# Patient Record
Sex: Female | Born: 1985 | Race: Asian | Hispanic: No | Marital: Married | Smoking: Never smoker
Health system: Southern US, Community
[De-identification: ages and names within clinical notes are randomized; demographics above are authoritative.]

## PROBLEM LIST (undated history)

## (undated) DIAGNOSIS — E039 Hypothyroidism, unspecified: Secondary | ICD-10-CM

## (undated) HISTORY — PX: NO PAST SURGERIES: SHX2092

---

## 2013-08-21 NOTE — L&D Delivery Note (Signed)
Delivery Note At 11:42 PM a viable female was delivered via Vaginal, Spontaneous Delivery (Presentation: ; Occiput Anterior).  APGAR: 8, 9; weight .   Placenta status: Intact, Spontaneous.  Cord: 3 vessels with the following complications: None. Marginal cord insertion and nuchal x 1 clamped and cut before delivery of shoulders. Cord pH: not indicated  Anesthesia: Epidural  Episiotomy: None Lacerations: 2nd degree;Perineal Suture Repair: 2.0 vicryl rapide Est. Blood Loss (mL): 400  Mom to postpartum.  Baby to Couplet care / Skin to Skin. Chorioamnionitis, on amp/ gent, will switch to gent/ clinda x 24 hrs.   [name] 06/09/2014, 12:05 AM

## 2013-10-14 ENCOUNTER — Other Ambulatory Visit: Payer: Self-pay | Admitting: Family Medicine

## 2013-10-14 DIAGNOSIS — E049 Nontoxic goiter, unspecified: Secondary | ICD-10-CM

## 2013-10-15 ENCOUNTER — Ambulatory Visit
Admission: RE | Admit: 2013-10-15 | Discharge: 2013-10-15 | Disposition: A | Discharge status: Home / Self Care | Payer: Managed Care, Other (non HMO) | Source: Ambulatory Visit | Attending: Family Medicine | Admitting: Family Medicine

## 2013-10-15 ENCOUNTER — Other Ambulatory Visit: Payer: Self-pay

## 2013-10-15 DIAGNOSIS — E049 Nontoxic goiter, unspecified: Secondary | ICD-10-CM

## 2013-10-20 ENCOUNTER — Other Ambulatory Visit: Payer: Self-pay | Admitting: Family Medicine

## 2013-10-20 DIAGNOSIS — E041 Nontoxic single thyroid nodule: Secondary | ICD-10-CM

## 2013-10-28 ENCOUNTER — Other Ambulatory Visit (HOSPITAL_COMMUNITY)
Admission: RE | Admit: 2013-10-28 | Discharge: 2013-10-28 | Disposition: A | Discharge status: Home / Self Care | Payer: Managed Care, Other (non HMO) | Source: Ambulatory Visit | Attending: Interventional Radiology | Admitting: Interventional Radiology

## 2013-10-28 ENCOUNTER — Ambulatory Visit
Admission: RE | Admit: 2013-10-28 | Discharge: 2013-10-28 | Disposition: A | Discharge status: Home / Self Care | Payer: Managed Care, Other (non HMO) | Source: Ambulatory Visit | Attending: Family Medicine | Admitting: Family Medicine

## 2013-10-28 DIAGNOSIS — E041 Nontoxic single thyroid nodule: Secondary | ICD-10-CM

## 2013-10-28 DIAGNOSIS — E049 Nontoxic goiter, unspecified: Secondary | ICD-10-CM | POA: Insufficient documentation

## 2014-06-08 ENCOUNTER — Encounter (HOSPITAL_COMMUNITY): Payer: Managed Care, Other (non HMO) | Admitting: Anesthesiology

## 2014-06-08 ENCOUNTER — Inpatient Hospital Stay (HOSPITAL_COMMUNITY)
Admission: AD | Admit: 2014-06-08 | Discharge: 2014-06-10 | DRG: 775 | Disposition: A | Discharge status: Home / Self Care | Payer: Managed Care, Other (non HMO) | Source: Ambulatory Visit | Attending: Obstetrics & Gynecology | Admitting: Obstetrics & Gynecology

## 2014-06-08 ENCOUNTER — Encounter (HOSPITAL_COMMUNITY): Payer: Self-pay | Admitting: *Deleted

## 2014-06-08 ENCOUNTER — Inpatient Hospital Stay (HOSPITAL_COMMUNITY): Payer: Managed Care, Other (non HMO) | Admitting: Anesthesiology

## 2014-06-08 DIAGNOSIS — O42913 Preterm premature rupture of membranes, unspecified as to length of time between rupture and onset of labor, third trimester: Principal | ICD-10-CM | POA: Diagnosis present

## 2014-06-08 DIAGNOSIS — O411230 Chorioamnionitis, third trimester, not applicable or unspecified: Secondary | ICD-10-CM | POA: Diagnosis present

## 2014-06-08 DIAGNOSIS — O99613 Diseases of the digestive system complicating pregnancy, third trimester: Secondary | ICD-10-CM | POA: Diagnosis present

## 2014-06-08 DIAGNOSIS — K219 Gastro-esophageal reflux disease without esophagitis: Secondary | ICD-10-CM | POA: Diagnosis present

## 2014-06-08 DIAGNOSIS — Z3A36 36 weeks gestation of pregnancy: Secondary | ICD-10-CM | POA: Diagnosis present

## 2014-06-08 DIAGNOSIS — O99113 Other diseases of the blood and blood-forming organs and certain disorders involving the immune mechanism complicating pregnancy, third trimester: Secondary | ICD-10-CM | POA: Diagnosis present

## 2014-06-08 DIAGNOSIS — E039 Hypothyroidism, unspecified: Secondary | ICD-10-CM | POA: Diagnosis present

## 2014-06-08 DIAGNOSIS — D72829 Elevated white blood cell count, unspecified: Secondary | ICD-10-CM | POA: Diagnosis present

## 2014-06-08 DIAGNOSIS — O99284 Endocrine, nutritional and metabolic diseases complicating childbirth: Secondary | ICD-10-CM | POA: Diagnosis present

## 2014-06-08 DIAGNOSIS — O411290 Chorioamnionitis, unspecified trimester, not applicable or unspecified: Secondary | ICD-10-CM | POA: Diagnosis not present

## 2014-06-08 HISTORY — DX: Hypothyroidism, unspecified: E03.9

## 2014-06-08 LAB — TYPE AND SCREEN
ABO/RH(D): A POS
Antibody Screen: NEGATIVE

## 2014-06-08 LAB — CBC
HCT: 40.4 % (ref 36.0–46.0)
Hemoglobin: 13.9 g/dL (ref 12.0–15.0)
MCH: 32 pg (ref 26.0–34.0)
MCHC: 34.4 g/dL (ref 30.0–36.0)
MCV: 92.9 fL (ref 78.0–100.0)
Platelets: 238 10*3/uL (ref 150–400)
RBC: 4.35 MIL/uL (ref 3.87–5.11)
RDW: 13.7 % (ref 11.5–15.5)
WBC: 11.4 10*3/uL — ABNORMAL HIGH (ref 4.0–10.5)

## 2014-06-08 LAB — OB RESULTS CONSOLE RUBELLA ANTIBODY, IGM: Rubella: IMMUNE

## 2014-06-08 LAB — OB RESULTS CONSOLE RPR: RPR: NONREACTIVE

## 2014-06-08 LAB — OB RESULTS CONSOLE ANTIBODY SCREEN: Antibody Screen: NEGATIVE

## 2014-06-08 LAB — RPR

## 2014-06-08 LAB — OB RESULTS CONSOLE HIV ANTIBODY (ROUTINE TESTING): HIV: NONREACTIVE

## 2014-06-08 LAB — POCT FERN TEST
POCT Fern Test: NEGATIVE
POCT Fern Test: POSITIVE

## 2014-06-08 LAB — OB RESULTS CONSOLE GC/CHLAMYDIA
Chlamydia: NEGATIVE
Gonorrhea: NEGATIVE

## 2014-06-08 LAB — OB RESULTS CONSOLE HEPATITIS B SURFACE ANTIGEN: HEP B S AG: NEGATIVE

## 2014-06-08 LAB — OB RESULTS CONSOLE ABO/RH: RH Type: POSITIVE

## 2014-06-08 LAB — ABO/RH: ABO/RH(D): A POS

## 2014-06-08 LAB — OB RESULTS CONSOLE GBS: GBS: NEGATIVE

## 2014-06-08 MED ORDER — SODIUM CHLORIDE 0.9 % IV SOLN
2.0000 g | Freq: Four times a day (QID) | INTRAVENOUS | Status: DC
  Administered 2014-06-08: 2 g via INTRAVENOUS
  Filled 2014-06-08 (×4): qty 2000

## 2014-06-08 MED ORDER — LACTATED RINGERS IV SOLN
INTRAVENOUS | Status: DC
  Administered 2014-06-08 (×2): via INTRAVENOUS

## 2014-06-08 MED ORDER — OXYTOCIN BOLUS FROM INFUSION
500.0000 mL | INTRAVENOUS | Status: DC
  Administered 2014-06-08: 500 mL via INTRAVENOUS

## 2014-06-08 MED ORDER — LACTATED RINGERS IV SOLN
500.0000 mL | INTRAVENOUS | Status: DC | PRN
  Administered 2014-06-08: 300 mL via INTRAVENOUS
  Administered 2014-06-08: 500 mL via INTRAVENOUS

## 2014-06-08 MED ORDER — LACTATED RINGERS IV SOLN
500.0000 mL | Freq: Once | INTRAVENOUS | Status: AC
  Administered 2014-06-08: 500 mL via INTRAVENOUS

## 2014-06-08 MED ORDER — DIPHENHYDRAMINE HCL 50 MG/ML IJ SOLN: 12.5000 mg | INTRAMUSCULAR | Status: DC | PRN

## 2014-06-08 MED ORDER — GENTAMICIN SULFATE 40 MG/ML IJ SOLN
1.5000 mg/kg | Freq: Three times a day (TID) | INTRAVENOUS | Status: DC
  Administered 2014-06-08: 90 mg via INTRAVENOUS
  Filled 2014-06-08 (×3): qty 2.25

## 2014-06-08 MED ORDER — CITRIC ACID-SODIUM CITRATE 334-500 MG/5ML PO SOLN: 30.0000 mL | ORAL | Status: DC | PRN

## 2014-06-08 MED ORDER — LIDOCAINE HCL (PF) 1 % IJ SOLN
30.0000 mL | INTRAMUSCULAR | Status: DC | PRN
  Administered 2014-06-08: 30 mL via SUBCUTANEOUS
  Filled 2014-06-08: qty 30

## 2014-06-08 MED ORDER — LIDOCAINE HCL (PF) 1 % IJ SOLN
INTRAMUSCULAR | Status: DC | PRN
  Administered 2014-06-08: 3 mL
  Administered 2014-06-08: 3.5 mL

## 2014-06-08 MED ORDER — EPHEDRINE 5 MG/ML INJ
10.0000 mg | INTRAVENOUS | Status: DC | PRN
  Filled 2014-06-08: qty 2

## 2014-06-08 MED ORDER — OXYCODONE-ACETAMINOPHEN 5-325 MG PO TABS: 1.0000 | ORAL_TABLET | ORAL | Status: DC | PRN

## 2014-06-08 MED ORDER — TERBUTALINE SULFATE 1 MG/ML IJ SOLN
0.2500 mg | Freq: Once | INTRAMUSCULAR | Status: AC | PRN
Start: 2014-06-08 — End: 2014-06-08

## 2014-06-08 MED ORDER — FENTANYL 2.5 MCG/ML BUPIVACAINE 1/10 % EPIDURAL INFUSION (WH - ANES)
14.0000 mL/h | INTRAMUSCULAR | Status: DC | PRN
  Administered 2014-06-08 (×2): 14 mL/h via EPIDURAL
  Filled 2014-06-08 (×2): qty 125

## 2014-06-08 MED ORDER — PHENYLEPHRINE 40 MCG/ML (10ML) SYRINGE FOR IV PUSH (FOR BLOOD PRESSURE SUPPORT)
80.0000 ug | PREFILLED_SYRINGE | INTRAVENOUS | Status: DC | PRN
  Filled 2014-06-08: qty 2

## 2014-06-08 MED ORDER — LEVOTHYROXINE SODIUM 50 MCG PO TABS
50.0000 ug | ORAL_TABLET | Freq: Every day | ORAL | Status: DC
  Filled 2014-06-08: qty 1

## 2014-06-08 MED ORDER — ACETAMINOPHEN 325 MG PO TABS
650.0000 mg | ORAL_TABLET | ORAL | Status: DC | PRN
  Administered 2014-06-08 (×2): 650 mg via ORAL
  Filled 2014-06-08 (×2): qty 2

## 2014-06-08 MED ORDER — LEVOTHYROXINE SODIUM 50 MCG PO TABS
50.0000 ug | ORAL_TABLET | Freq: Every day | ORAL | Status: DC
Start: 2014-06-08 — End: 2014-06-08
  Filled 2014-06-08: qty 1

## 2014-06-08 MED ORDER — OXYCODONE-ACETAMINOPHEN 5-325 MG PO TABS: 2.0000 | ORAL_TABLET | ORAL | Status: DC | PRN

## 2014-06-08 MED ORDER — BUPIVACAINE HCL (PF) 0.25 % IJ SOLN
INTRAMUSCULAR | Status: DC | PRN
  Administered 2014-06-08 (×2): 5 mL

## 2014-06-08 MED ORDER — PHENYLEPHRINE 40 MCG/ML (10ML) SYRINGE FOR IV PUSH (FOR BLOOD PRESSURE SUPPORT)
80.0000 ug | PREFILLED_SYRINGE | INTRAVENOUS | Status: DC | PRN
  Filled 2014-06-08 (×2): qty 10
  Filled 2014-06-08: qty 2

## 2014-06-08 MED ORDER — FENTANYL 2.5 MCG/ML BUPIVACAINE 1/10 % EPIDURAL INFUSION (WH - ANES)
INTRAMUSCULAR | Status: DC | PRN
  Administered 2014-06-08: 12.5 mL/h via EPIDURAL

## 2014-06-08 MED ORDER — OXYTOCIN 40 UNITS IN LACTATED RINGERS INFUSION - SIMPLE MED: 62.5000 mL/h | INTRAVENOUS | Status: DC

## 2014-06-08 MED ORDER — OXYTOCIN 40 UNITS IN LACTATED RINGERS INFUSION - SIMPLE MED
1.0000 m[IU]/min | INTRAVENOUS | Status: DC
  Administered 2014-06-08: 1 m[IU]/min via INTRAVENOUS
  Filled 2014-06-08: qty 1000

## 2014-06-08 NOTE — Progress Notes (Signed)
S: Doing well, no complaints, pain somewhat controlled with epidural, still feeling contractions, pressure- s/p additional bolus meds and anasthesia consult.   O: BP 126/85  Pulse 128  Temp(Src) 101 F (38.3 C) (Axillary)  Resp 20  Ht 5' (1.524 m)  Wt 76.658 kg (169 lb)  BMI 33.01 kg/m2  SpO2 99%   FHT:  FHR: 155s bpm, variability: moderate,  accelerations:  Present,  decelerations:  Absent UC:   regular, every 2 minutes SVE:   Dilation: 10 Effacement (%): 100 Station: +2 Exam by:: OGE EnergyFarmer RN   A / P:  28 y.o.  Obstetric History   G1   P0   T0   P0   A0   TAB0   SAB0   E0   M0   L0    at 2871w4d Induction of labor due to PROM,  progressing well on pitocin  Fetal Wellbeing:  Category I Pain Control:  Epidural  Anticipated MOD:  Unclear, vtx at +1, caput/ molding at +3  Chorio- no amp/ gent  Maria Hale A. 06/08/2014, 11:24 PM

## 2014-06-08 NOTE — Progress Notes (Signed)
Patient to receive 650mg  tylenol po per Dr. Ernestina PennaFogleman since patient vomited immediately after receiving initial dose at 2037.

## 2014-06-08 NOTE — Plan of Care (Signed)
Problem: Phase I Progression Outcomes Goal: Pain controlled with appropriate interventions Outcome: Completed/Met Date Met:  06/08/14 Pt comfortable with epidural

## 2014-06-08 NOTE — MAU Note (Signed)
Pt reports ROM at 0430, denies contractions.

## 2014-06-08 NOTE — Progress Notes (Signed)
Subjective: Doing well, pain increasing, UCs prob q3 min, but not recording well  Anesthesia  none   Objective: BP 116/67  Pulse 74  Temp(Src) 97.8 F (36.6 C) (Oral)  Resp 20  Ht 5' (1.524 m)  Wt 76.658 kg (169 lb)  BMI 33.01 kg/m2  SpO2 100%   FHT:  FHR: 140's bpm, variability: moderate,  accelerations:  Present,  decelerations:  Absent UC:   regular, every 3 minutes VE:   3/90%/Vtx/-2 Clear AF   Assessment / Plan: Augmentation of labor, progressing well  Fetal Wellbeing:  Category I Pain Control:  Labor support without medications currently, Epidural PRN.  Anticipated MOD:  NSVD  Maria Hale,Maria Hale 06/08/2014, 10:58 AM

## 2014-06-08 NOTE — Progress Notes (Signed)
S: Doing well, no complaints, pain well controlled with epidural  O: BP 127/68  Pulse 72  Temp(Src) 97.9 F (36.6 C) (Oral)  Resp 20  Ht 5' (1.524 m)  Wt 76.658 kg (169 lb)  BMI 33.01 kg/m2  SpO2 100%   FHT:  FHR: 120s bpm, variability: moderate,  accelerations:  Present,  decelerations:  Absent UC:   regular, every 3 minutes SVE:   Dilation: 4 Effacement (%): 100 Station: +1 Exam by:: dr Ernestina Pennafogleman   A / P:  28 y.o.  Obstetric History   G1   P0   T0   P0   A0   TAB0   SAB0   E0   M0   L0    at 5736w4d Augmentation after premature PPROM, slow progress on pitocin  Fetal Wellbeing:  Category I Pain Control:  Epidural  Anticipated MOD:  NSVD likely, slow progress, IUPC placed now, re-eval in a few hrs.   Eddie Payette A. 06/08/2014, 4:46 PM

## 2014-06-08 NOTE — Progress Notes (Signed)
Subjective: Doing well, pain mild, UCs q3-4 min  Anesthesia none   Objective: BP 121/60  Pulse 77  Temp(Src) 98.1 F (36.7 C) (Oral)  Resp 20  Ht 5' (1.524 m)  Wt 76.658 kg (169 lb)  BMI 33.01 kg/m2  SpO2 100%   FHT:  FHR: 140's bpm, variability: moderate,  accelerations:  Present,  decelerations:  Absent UC:   regular, every 3-4 minutes VE:   Dilation: 1.5 Effacement (%): 70 Station: -2 Exam by:: Wiliam Ke Bailey CNM   Assessment / Plan: SROM in early labor.  Will augment with Pitocin.  Fetal Wellbeing:  Category I Pain Control:  Epidural PRN  Anticipated MOD:  NSVD  Maria Hale,Maria Hale 06/08/2014, 9:03 AM

## 2014-06-08 NOTE — Anesthesia Preprocedure Evaluation (Signed)
Anesthesia Evaluation  Patient identified by MRN, date of birth, ID band Patient awake    Reviewed: Allergy & Precautions, H&P , Patient's Chart, lab work & pertinent test results  Airway Mallampati: III TM Distance: >3 FB Neck ROM: Full    Dental no notable dental hx. (+) Teeth Intact   Pulmonary neg pulmonary ROS,  breath sounds clear to auscultation  Pulmonary exam normal       Cardiovascular negative cardio ROS  Rhythm:Regular Rate:Normal     Neuro/Psych negative neurological ROS  negative psych ROS   GI/Hepatic negative GI ROS, Neg liver ROS, GERD-  ,  Endo/Other  Hypothyroidism Obesity  Renal/GU negative Renal ROS  negative genitourinary   Musculoskeletal negative musculoskeletal ROS (+)   Abdominal (+) + obese,   Peds  Hematology negative hematology ROS (+)   Anesthesia Other Findings   Reproductive/Obstetrics (+) Pregnancy                           Anesthesia Physical Anesthesia Plan  ASA: II  Anesthesia Plan: Epidural   Post-op Pain Management:    Induction:   Airway Management Planned: Natural Airway  Additional Equipment:   Intra-op Plan:   Post-operative Plan:   Informed Consent: I have reviewed the patients History and Physical, chart, labs and discussed the procedure including the risks, benefits and alternatives for the proposed anesthesia with the patient or authorized representative who has indicated his/her understanding and acceptance.     Plan Discussed with: Anesthesiologist  Anesthesia Plan Comments:         Anesthesia Quick Evaluation

## 2014-06-08 NOTE — H&P (Signed)
  OB ADMISSION/ HISTORY & PHYSICAL:  Admission Date: 06/08/2014  6:17 AM  Admit Diagnosis: 36.4 weeks / SROM / onset of labor  Maria Hale is a 28 y.o. female presenting for SROM and onset of labor.  Prenatal History: G1P0   EDC : 07/02/2014, Date entered prior to episode creation  [facility]  Primary Ob Provider: Aiana Nordquist Prenatal course complicated by hypothyroidism  Prenatal Labs: ABO, Rh:  A+ Antibody:  negative Rubella:   Immune RPR:   NR HBsAg:   negative HIV:   NR GTT: 134 GBS:   (10/8)  Medical / Surgical History :  Past medical history:  Past Medical History  Diagnosis Date  . Hypothyroidism      Past surgical history:  Past Surgical History  Procedure Laterality Date  . No past surgeries      Family History: No family history on file.   Social History:  reports that she has never smoked. She does not have any smokeless tobacco history on file. She reports that she does not drink alcohol or use illicit drugs.   Allergies: Review of patient's allergies indicates no known allergies.    Current Medications at time of admission:  Prior to Admission medications   Medication Sig Start Date End Date Taking? Authorizing Provider  cholecalciferol (VITAMIN D) 1000 UNITS tablet Take 1,000 Units by mouth daily.   Yes Historical Provider, MD  levothyroxine (SYNTHROID, LEVOTHROID) 50 MCG tablet Take 50 mcg by mouth daily before breakfast.   Yes Historical Provider, MD  Prenatal Vit-Fe Fumarate-FA (MULTIVITAMIN-PRENATAL) 27-0.8 MG TABS tablet Take 1 tablet by mouth daily at 12 noon.   Yes Historical Provider, MD   Review of Systems: Active FM onset of ctx -mild and irregular LOF  / SROM @ 0500 bloody show absent  Physical Exam:  VS: Blood pressure 132/74, pulse 75, temperature 97.8 F (36.6 C), temperature source Oral, resp. rate 18, height 5' (1.524 m), weight 76.658 kg (169 lb), SpO2 100.00%.  General: alert and oriented,  appears NAD - no pain Heart: RRR Lungs: Clear lung fields Abdomen: Gravid, soft and non-tender, non-distended / uterus: gravid Extremities: no edema Ferning positive Genitalia / VE: Dilation: 1.5 Effacement (%): 70 Station: -2 Exam by:: T Hale CNM  FHR: baseline rate 145 / variability moderate / accelerations + / no decelerations TOCO: rare ctx  Assessment: 36.[redacted] weeks gestation latent stage of labor FHR category 1   Plan:  Admit Obtain GBS result - routine orders Plans epidural  Dr Seymour BarsLavoie notified of admission / plan of care   Marlinda MikeBAILEY, Maria CNM, MSN, [facility] 06/08/2014, 7:41 AM

## 2014-06-08 NOTE — Anesthesia Procedure Notes (Signed)
Epidural Patient location during procedure: OB Start time: 06/08/2014 12:14 PM  Staffing Anesthesiologist: Aliscia Clayton A. Performed by: anesthesiologist   Preanesthetic Checklist Completed: patient identified, site marked, surgical consent, pre-op evaluation, timeout performed, IV checked, risks and benefits discussed and monitors and equipment checked  Epidural Patient position: sitting Prep: site prepped and draped and DuraPrep Patient monitoring: continuous pulse ox and blood pressure Approach: midline Location: L3-L4 Injection technique: LOR air  Needle:  Needle type: Tuohy  Needle gauge: 17 G Needle length: 9 cm and 9 Needle insertion depth: 5 cm cm Catheter type: closed end flexible Catheter size: 19 Gauge Catheter at skin depth: 11 cm Test dose: negative and Other  Assessment Events: blood not aspirated, injection not painful, no injection resistance, negative IV test and no paresthesia  Additional Notes Patient identified. Risks and benefits discussed including failed block, incomplete  Pain control, post dural puncture headache, nerve damage, paralysis, blood pressure Changes, nausea, vomiting, reactions to medications-both toxic and allergic and post Partum back pain. All questions were answered. Patient expressed understanding and wished to proceed. Sterile technique was used throughout procedure. Epidural site was Dressed with sterile barrier dressing. No paresthesias, signs of intravascular injection Or signs of intrathecal spread were encountered.  Patient was more comfortable after the epidural was dosed. Please see RN's note for documentation of vital signs and FHR which are stable.

## 2014-06-08 NOTE — Progress Notes (Signed)
Patient given 650mg  tylenol po and patient vomited immediately after swallowing medication.

## 2014-06-09 ENCOUNTER — Encounter (HOSPITAL_COMMUNITY): Payer: Self-pay | Admitting: *Deleted

## 2014-06-09 DIAGNOSIS — O411290 Chorioamnionitis, unspecified trimester, not applicable or unspecified: Secondary | ICD-10-CM | POA: Diagnosis not present

## 2014-06-09 DIAGNOSIS — E039 Hypothyroidism, unspecified: Secondary | ICD-10-CM | POA: Diagnosis present

## 2014-06-09 LAB — CBC
HEMATOCRIT: 32.3 % — AB (ref 36.0–46.0)
HEMOGLOBIN: 11.1 g/dL — AB (ref 12.0–15.0)
MCH: 32.5 pg (ref 26.0–34.0)
MCHC: 34.7 g/dL (ref 30.0–36.0)
MCV: 93.6 fL (ref 78.0–100.0)
Platelets: 185 10*3/uL (ref 150–400)
RBC: 3.45 MIL/uL — ABNORMAL LOW (ref 3.87–5.11)
RDW: 13.6 % (ref 11.5–15.5)
WBC: 26.5 10*3/uL — ABNORMAL HIGH (ref 4.0–10.5)

## 2014-06-09 MED ORDER — TETANUS-DIPHTH-ACELL PERTUSSIS 5-2.5-18.5 LF-MCG/0.5 IM SUSP: 0.5000 mL | Freq: Once | INTRAMUSCULAR | Status: DC

## 2014-06-09 MED ORDER — ONDANSETRON HCL 4 MG/2ML IJ SOLN
4.0000 mg | INTRAMUSCULAR | Status: DC | PRN
  Administered 2014-06-09: 4 mg via INTRAVENOUS
  Filled 2014-06-09: qty 2

## 2014-06-09 MED ORDER — SIMETHICONE 80 MG PO CHEW: 80.0000 mg | CHEWABLE_TABLET | ORAL | Status: DC | PRN

## 2014-06-09 MED ORDER — IBUPROFEN 600 MG PO TABS
600.0000 mg | ORAL_TABLET | Freq: Four times a day (QID) | ORAL | Status: DC
  Administered 2014-06-09 – 2014-06-10 (×6): 600 mg via ORAL
  Filled 2014-06-09 (×6): qty 1

## 2014-06-09 MED ORDER — SODIUM CHLORIDE 0.9 % IJ SOLN: 3.0000 mL | INTRAMUSCULAR | Status: DC | PRN

## 2014-06-09 MED ORDER — ONDANSETRON HCL 4 MG PO TABS: 4.0000 mg | ORAL_TABLET | ORAL | Status: DC | PRN

## 2014-06-09 MED ORDER — DIPHENHYDRAMINE HCL 25 MG PO CAPS: 25.0000 mg | ORAL_CAPSULE | Freq: Four times a day (QID) | ORAL | Status: DC | PRN

## 2014-06-09 MED ORDER — BISACODYL 10 MG RE SUPP: 10.0000 mg | Freq: Every day | RECTAL | Status: DC | PRN

## 2014-06-09 MED ORDER — LEVOTHYROXINE SODIUM 50 MCG PO TABS
50.0000 ug | ORAL_TABLET | Freq: Every day | ORAL | Status: DC
  Filled 2014-06-09 (×2): qty 1

## 2014-06-09 MED ORDER — FLEET ENEMA 7-19 GM/118ML RE ENEM: 1.0000 | ENEMA | Freq: Every day | RECTAL | Status: DC | PRN

## 2014-06-09 MED ORDER — DIBUCAINE 1 % RE OINT: 1.0000 "application " | TOPICAL_OINTMENT | RECTAL | Status: DC | PRN

## 2014-06-09 MED ORDER — GENTAMICIN SULFATE 40 MG/ML IJ SOLN
7.0000 mg/kg | Freq: Once | INTRAVENOUS | Status: AC
  Administered 2014-06-09: 410 mg via INTRAVENOUS
  Filled 2014-06-09: qty 10.25

## 2014-06-09 MED ORDER — PRENATAL MULTIVITAMIN CH
1.0000 | ORAL_TABLET | Freq: Every day | ORAL | Status: DC
  Administered 2014-06-09 – 2014-06-10 (×2): 1 via ORAL
  Filled 2014-06-09 (×2): qty 1

## 2014-06-09 MED ORDER — OXYCODONE-ACETAMINOPHEN 5-325 MG PO TABS
1.0000 | ORAL_TABLET | ORAL | Status: DC | PRN
Start: 2014-06-09 — End: 2014-06-10
  Administered 2014-06-09: 1 via ORAL
  Filled 2014-06-09: qty 1

## 2014-06-09 MED ORDER — SENNOSIDES-DOCUSATE SODIUM 8.6-50 MG PO TABS
2.0000 | ORAL_TABLET | ORAL | Status: DC
  Administered 2014-06-09: 2 via ORAL
  Filled 2014-06-09: qty 2

## 2014-06-09 MED ORDER — LANOLIN HYDROUS EX OINT: TOPICAL_OINTMENT | CUTANEOUS | Status: DC | PRN

## 2014-06-09 MED ORDER — BENZOCAINE-MENTHOL 20-0.5 % EX AERO
1.0000 "application " | INHALATION_SPRAY | CUTANEOUS | Status: DC | PRN
  Filled 2014-06-09: qty 56

## 2014-06-09 MED ORDER — WITCH HAZEL-GLYCERIN EX PADS
1.0000 | MEDICATED_PAD | CUTANEOUS | Status: DC | PRN
Start: 2014-06-09 — End: 2014-06-10

## 2014-06-09 MED ORDER — CLINDAMYCIN PHOSPHATE 900 MG/50ML IV SOLN
900.0000 mg | Freq: Three times a day (TID) | INTRAVENOUS | Status: AC
  Administered 2014-06-09 (×3): 900 mg via INTRAVENOUS
  Filled 2014-06-09 (×3): qty 50

## 2014-06-09 MED ORDER — OXYCODONE-ACETAMINOPHEN 5-325 MG PO TABS: 2.0000 | ORAL_TABLET | ORAL | Status: DC | PRN

## 2014-06-09 MED ORDER — SODIUM CHLORIDE 0.9 % IV SOLN
INTRAVENOUS | Status: AC
  Administered 2014-06-09: 05:00:00 via INTRAVENOUS

## 2014-06-09 MED ORDER — ZOLPIDEM TARTRATE 5 MG PO TABS: 5.0000 mg | ORAL_TABLET | Freq: Every evening | ORAL | Status: DC | PRN

## 2014-06-09 NOTE — Anesthesia Postprocedure Evaluation (Signed)
  Anesthesia Post-op Note  Patient: Maria Hale  Procedure(s) Performed: * No procedures listed *  Patient Location: PACU and Mother/Baby  Anesthesia Type:Epidural  Level of Consciousness: awake, alert  and oriented  Airway and Oxygen Therapy: Patient Spontanous Breathing  Post-op Pain: mild  Post-op Assessment: Patient's Cardiovascular Status Stable, Respiratory Function Stable, No signs of Nausea or vomiting, Adequate PO intake, Pain level controlled, No headache, No backache, No residual numbness and No residual motor weakness  Post-op Vital Signs: Reviewed and stable  Last Vitals:  Filed Vitals:   06/09/14 0730  BP: 90/56  Pulse: 81  Temp: 36.7 C  Resp: 20    Complications: No apparent anesthesia complications

## 2014-06-09 NOTE — Lactation Note (Signed)
This note was copied from the chart of Girl Hendy Gene. Lactation Consultation Note  Patient Name: Girl Miguel Rotaeelima Doscher ZOXWR'UToday's Date: 06/09/2014 Reason for consult: Initial assessment;Late preterm infant  Visited with Mom and FOB, baby at 149 hrs old.  Mom has put baby to the breast regularly, and baby opens and falls asleep at the breast.  Assisted with positioning and hand placement on breasts.  A couple drops of colostrum expressed from breasts.  Basics of breast feeding discussed.  Encouraged regular skin to skin with explanation on benefits. Set up DEBP with explanation on use.  Discussed importance of supplementation due to baby being late preterm.  Instructed FOB how to use side lying position while feeding baby 7 ml formula by slow flow bottle.  Mom and Dad feel comfortable with plan to pump and supplement after breast feeding, or skin to skin nuzzling, at least every 3 hrs.  Brochure left in room.  Explained about IP and OP lactation services available to her.   To call prn for assistance.  Follow up in am.  Consult Status Consult Status: Follow-up Date: 06/10/14 Follow-up type: In-patient    Judee ClaraSmith, Izzah Pasqua E 06/09/2014, 10:14 AM

## 2014-06-09 NOTE — Progress Notes (Addendum)
PPD #1- SVD  Subjective:   Reports feeling well Tolerating po/ No nausea or vomiting Bleeding is light Pain controlled with Motrin Up ad lib / ambulatory / voiding without problems Newborn: breast and formula feeding     Objective:   VS:  VS:  Filed Vitals:   06/09/14 0229 06/09/14 0332 06/09/14 0524 06/09/14 0730  BP: 112/52 122/67  90/56  Pulse: 88 93  81  Temp: 99.7 F (37.6 C) 99.6 F (37.6 C) 99.5 F (37.5 C) 98.1 F (36.7 C)  TempSrc: Oral Oral Oral Oral  Resp: 20 20  20   Height:      Weight:      SpO2: 99%       LABS:  Recent Labs  06/08/14 0800 06/09/14 0555  WBC 11.4* 26.5*  HGB 13.9 11.1*  PLT 238 185   Blood type: --/--/A POS, A POS (10/19 0800) Rubella: Immune (10/19 0000)   I&O: Intake/Output     10/19 0701 - 10/20 0700 10/20 0701 - 10/21 0700   I.V. (mL/kg) 33.3 (0.4)    IV Piggyback 262.5    Total Intake(mL/kg) 295.8 (3.9)    Urine (mL/kg/hr) 850 (0.5)    Blood 400 (0.2)    Total Output 1250     Net -954.2          Urine Occurrence       Physical Exam: Alert and oriented x3 Heart: RRR Lungs: CTA bilaterally Abdomen: soft, non-tender, non-distended  Fundus: firm, non-tender, U-2 Perineum: Well approximated, no significant erythema, edema, or drainage; healing well. Lochia: mod Extremities: No edema, no calf pain or tenderness    Assessment:  PPD #1 G1P0101/ S/P:spontaneous vaginal, 2nd degree laceration Chorioamnionitis, delivered Leukocytosis Hypothyroidism   Plan: Continue Clindamycin as previously ordered Continue routine post partum orders Dr. Ernestina PennaFogleman updated with assessment, MD to evaluate pt.  Donette LarryBHAMBRI, Abraham Entwistle, N MSN, CNM 06/09/2014, 10:42 AM

## 2014-06-09 NOTE — Lactation Note (Signed)
This note was copied from the chart of Maria Hale. Lactation Consultation Note  Patient Name: Maria Hale Reason for consult: Follow-up assessment;Late preterm infant.  Baby is 23 hours postpartum and has not been feeding well, either at breast or with bottle.  LC discussed assessment with RN, Shanda BumpsJessica and reinforced with mom the importance of regular q3h feedings and pumping until baby is latching well without need for supplement.  Mom was given the LPI handout and LC encouraged her to review her baby's special needs.  LC also encouraged mom to call for latch and/or bottle-feeding assistance as needed tonight.   Maternal Data    Feeding Feeding Type: Bottle Fed - Formula Nipple Type: Slow - flow  LATCH Score/Interventions                      Lactation Tools Discussed/Used Tools: Bottle DEBP; regular pumping to stimulate milk production LPI handout and special feeding needs  Consult Status Consult Status: Follow-up Date: 06/10/14 Follow-up type: In-patient    Warrick ParisianBryant, Torian Thoennes [facility] Hale, 10:42 PM

## 2014-06-10 LAB — CBC
HCT: 29.7 % — ABNORMAL LOW (ref 36.0–46.0)
Hemoglobin: 10.3 g/dL — ABNORMAL LOW (ref 12.0–15.0)
MCH: 32.3 pg (ref 26.0–34.0)
MCHC: 34.7 g/dL (ref 30.0–36.0)
MCV: 93.1 fL (ref 78.0–100.0)
PLATELETS: 199 10*3/uL (ref 150–400)
RBC: 3.19 MIL/uL — ABNORMAL LOW (ref 3.87–5.11)
RDW: 14 % (ref 11.5–15.5)
WBC: 16.7 10*3/uL — ABNORMAL HIGH (ref 4.0–10.5)

## 2014-06-10 MED ORDER — OXYCODONE-ACETAMINOPHEN 5-325 MG PO TABS: 1.0000 | ORAL_TABLET | ORAL | Status: DC | PRN

## 2014-06-10 MED ORDER — MAGNESIUM OXIDE 400 (241.3 MG) MG PO TABS
400.0000 mg | ORAL_TABLET | Freq: Every day | ORAL | Status: DC
  Administered 2014-06-10: 400 mg via ORAL
  Filled 2014-06-10 (×2): qty 1

## 2014-06-10 MED ORDER — IBUPROFEN 800 MG PO TABS: 800.0000 mg | ORAL_TABLET | Freq: Three times a day (TID) | ORAL | Status: DC | PRN

## 2014-06-10 MED ORDER — MAGNESIUM OXIDE 400 (241.3 MG) MG PO TABS: 400.0000 mg | ORAL_TABLET | Freq: Every day | ORAL | Status: DC

## 2014-06-10 MED ORDER — VITAMIN D 50 MCG (2000 UT) PO TABS: 2000.0000 [IU] | ORAL_TABLET | ORAL | Status: DC

## 2014-06-10 NOTE — Progress Notes (Signed)
PPD 2 SVD  S:  Reports feeling well - ready to go home - tired with no sleep             Tolerating po/ No nausea or vomiting             Bleeding is light             Pain controlled with motrin and percocet             Up ad lib / ambulatory / voiding QS / states constipation throughout pregnancy - feels constipated now  Newborn breast feeding    O:               VS: BP 101/45  Pulse 68  Temp(Src) 98.1 F (36.7 C) (Oral)  Resp 18  Ht 5' (1.524 m)  Wt 76.658 kg (169 lb)  BMI 33.01 kg/m2  SpO2 99%  Breastfeeding? Unknown   LABS:              Recent Labs  06/09/14 0555 06/10/14 0550  WBC 26.5* 16.7*  HGB 11.1* 10.3*  PLT 185 199               Blood type: --/--/A POS, A POS (10/19 0800)  Rubella: Immune (10/19 0000)                                Physical Exam:             Alert and oriented X3  Abdomen: soft, non-tender, non-distended, active BS             Fundus: firm, non-tender, U-1  Perineum: mild edema  Lochia: light rubra  Extremities: trace edema, no calf pain or tenderness    A: PPD # 2   Doing well - stable status             constipation  P: Routine post partum orders             Mag OX 400mg  this am then 200-400 mg daily with PNV  DC home  Maria Hale, Maria Hale CNM, MSN, [facility]ACNM 06/10/2014, 8:42 AM

## 2014-06-10 NOTE — Discharge Summary (Signed)
Obstetric Discharge Summary  Reason for Admission: onset of labor Prenatal Procedures: none Intrapartum Procedures: spontaneous vaginal delivery and epidural Postpartum Procedures: antibiotics for chorioamnionitis Complications-Operative and Postpartum: 2nd degree perineal laceration Hemoglobin  Date Value Ref Range Status  06/10/2014 10.3* 12.0 - 15.0 g/dL Final     HCT  Date Value Ref Range Status  06/10/2014 29.7* 36.0 - 46.0 % Final    Physical Exam:  General: alert, cooperative and no distress Lochia: appropriate Uterine Fundus: firm Incision: healing well DVT Evaluation: No evidence of DVT seen on physical exam.  Discharge Diagnoses: Term Pregnancy-delivered and Amnionitis-resolving / afebrile  Discharge Information: Date: 06/10/2014 Activity: pelvic rest Diet: routine Medications: PNV, Ibuprofen, Percocet and Synthroid and Magnesium and Vitamin D Condition: stable Instructions: refer to practice specific booklet Discharge to: home Follow-up Information   Follow up with MODY,VAISHALI R, MD. Schedule an appointment as soon as possible for a visit in 6 weeks.   Specialty:  Obstetrics and Gynecology   Contact information:   [address] Gash[address] [place] [id number] [phone]       Newborn Data: Live born female  Birth Weight: 6 lb 5.8 oz (2885 g) APGAR: 8, 9  Home with mother.  Marlinda MikeBAILEY, Tymeka Privette 06/10/2014, 9:05 AM

## 2014-06-10 NOTE — Discharge Instructions (Signed)
DRINK WATER at least 4 water bottle per day while breastfeeding Warm teas and soups will help prevent constipation Magnesium prescription - take 1/2 to 1 tablet EVERY day to prevent constipation Vitamin D increased to 2000iu daily

## 2014-06-10 NOTE — Lactation Note (Signed)
This note was copied from the chart of Maria Hale. Lactation Consultation Note  Mother states she has no breastmilk.  Demonstrated hand expression and drops expressed. Assisted mother in bf in cross cradle hold.  Intermittent sucks observed. Reviewed waking techniques and breast massage to keep her active. Suggest she compress her breast to achieve a deeper latch. Recommend after breastfeeding grandmother give formula supplement and mother post pump for 15 min. Reviewed supply and demand and stomach size. Encouraged her to bf and pump every three hours.  Mother has not pumped since last night.  Patient Name: Maria Hale ZOXWR'UToday's Date: 06/10/2014 Reason for consult: Follow-up assessment   Maternal Data    Feeding    [facility]ventions                      Lactation Tools Discussed/Used     Consult Status      Hardie PulleyBerkelhammer, Sanjith Siwek Boschen 06/10/2014, 10:35 AM

## 2014-06-10 NOTE — Discharge Summary (Signed)
Reviewed and agree with note and plan. V.Glennon Kopko, MD  

## 2014-06-11 ENCOUNTER — Ambulatory Visit: Payer: Self-pay

## 2014-06-11 NOTE — Lactation Note (Signed)
This note was copied from the chart of Girl Loie Wiltrout. Lactation Consultation Note    Follow up consult with this mom and LPT baby, now 37 weeks CGA at 59 hours pp. Mom has not been breast feeding, and only pumping 3 times a day. I rented her a DEP, encouraged her to pump every 3 hours followed by hand expression, and explained supply and demand to her. Mom has a DEP arriving from [employer] in 5 days. Parents were feeding 7-10 mls of formula at a time. I showed them how to burp the baby, and despite closed eyes, offer the nipple to her. She took 27 mls  Of formula with this feeing. Dad demonstrated burping and did well. They were givesn some formula to take home, and advised to speak to their pediatrician about what formula to supplement breast with. Mom is expressing only drops of colostrum. I told erh to pump every 3 hours, followed by hand expression, and to feed baby first what she expressed. I made an o/p appointment for them for later next week.   Patient Name: Girl Miguel Rotaeelima Wrenn ZOXWR'UToday's Date: 06/11/2014     Maternal Data    Feeding    LATCH Score/Interventions                      Lactation Tools Discussed/Used     Consult Status      Alfred LevinsLee, Adrik Khim Anne 06/11/2014, 4:46 PM

## 2014-06-18 ENCOUNTER — Ambulatory Visit: Payer: Self-pay

## 2014-06-18 NOTE — Lactation Note (Signed)
This note was copied from the chart of Ameliya Mazon. Lactation Consult  Mother's reason for visit:  Baby is not latching properly, nipple is hard and painful when baby tries to breast feed. Mom would like to get baby to breast.  Visit Type:  Outpatient - Feeding Assessment, Difficult latch Appointment Notes:  Mom's breasts are full, aerola is slightly firm. Demonstrated hand expression to Mom and advised hand express or pre-pump to help with latch. Mom has superficial cracking at the base of both nipples, Mom reports pain with trying to breastfeed. Baby is not sustaining a latch, falling asleep after 5 minutes per Mom's report.  Consult:  Initial Lactation Consultant:  Alfred LevinsGranger, Lukka Black Ann  ________________________________________________________________________   Joan FloresBaby's Name: Jori MollAmeliya Bistline  Date of Birth: 06/08/2014  Pediatrician: Dr. Donnie Coffinubin Gender: female  Gestational Age: 7466w4d (At Birth)  Birth Weight: 6 lb 5.8 oz (2885 g)  Weight at Discharge: Date of Discharge:  There were no vitals filed for this visit.  Last weight taken from location outside of [facility]: Last week at Peds - 6 lb. 0.0 oz. Location:Pediatrician's office  Weight today: 2870 gm.  6 lb. 345.223 oz at 2810 days old.   ________________________________________________________________________  Mother's Name: Jori MollAmeliya Seehafer Type of delivery:  Vaginal, Spontaneous Delivery Breastfeeding Experience:  P1 Maternal Medical Conditions:  Hypothyroid Maternal Medications:  Levothyroxine  ________________________________________________________________________  Breastfeeding History (Post Discharge)  Frequency of breastfeeding:  3-4 times/day Duration of feeding:  5 minutes  Supplementation  Formula:  Volume 60 ml Frequency:  3-4/day Total volume per day:  90-120 ml       Brand: Enfamil  Breastmilk:  Volume 40 ml Frequency:  5/day Total volume per day:  200ml  Method:  Bottle  Pumping - every 2 1/2 hours obtaining  40 ml of breast milk. Mom is using Symphony Pump at present. She is waiting on her Hygia pump from insurance.   Infant Intake and Output Assessment  Voids:  7-8 in 24 hrs.  Color:  Clear yellow Stools:  6 in 24 hrs.  Color:  Yellow  ________________________________________________________________________  Maternal Breast Assessment  Breast:  Full Nipple:  Erect Pain level:  5 Pain interventions:  Expressed breast milk  _______________________________________________________________________ Feeding Assessment/Evaluation  Initial feeding assessment:  Infant's oral assessment:  WNL  Positioning:  Football Right breast  LATCH documentation:  Latch:  1 = Repeated attempts needed to sustain latch, nipple held in mouth throughout feeding, stimulation needed to elicit sucking reflex.   Audible swallowing:  1 = A few with stimulation  Type of nipple:  2 = Everted at rest and after stimulation  Comfort (Breast/Nipple):  0 = Engorged, cracked, bleeding, large blisters, severe discomfort  Hold (Positioning):  1 = Assistance needed to correctly position infant at breast and maintain latch  LATCH score:  5  Attached assessment:  Shallow, Initiated #20 nipple shield for depth and for baby to sustain latch  Lips flanged:  No. without nipple shield  Lips untucked:  No. without nipple shield  Suck assessment:  Nutritive  Tools:  Nipple shield 20 mm Instructed on use and cleaning of tool:  Yes.    Pre-feed weight:  2870 g  (6 lb. 5.3 oz.) Post-feed weight:  2888 g (6 lb. 5.9 oz.) Amount transferred:  18 ml  PS=0 with nipple shield Amount supplemented:  0ml  Additional Feeding Assessment -   Infant's oral assessment:  WNL  Positioning:  Football Left breast  LATCH documentation:  Latch:  1 =  Repeated attempts needed to sustain latch, nipple held in mouth throughout feeding, stimulation needed to elicit sucking reflex.  Audible swallowing:  1 = A few with stimulation  Type of  nipple:  2 = Everted at rest and after stimulation  Comfort (Breast/Nipple):  0 = Engorged, cracked, bleeding, large blisters, severe discomfort  Hold (Positioning):  1 = Assistance needed to correctly position infant at breast and maintain latch  LATCH score:  5  Attached assessment:  Deep using #20 nipple shield  Lips flanged:  Yes.    Lips untucked:  Yes.    Suck assessment:  Nutritive  Tools:  Nipple shield 20 mm Instructed on use and cleaning of tool:  Yes.    Pre-feed weight:  2888g  (6 lb. 5.9 oz.) Post-feed weight:  2898 g (6 lb. 6.2 oz.) Amount transferred:  10 ml PS=0 using nipple shield Amount supplemented:  25 ml  EBM via bottle   Total amount pumped post feed:  N/A  Total amount transferred:  28 ml Total supplement given:  25 ml  Care for sore nipples reviewed with Mom. Comfort gels given with instructions. Plan given to Mom: BF every feeding - baby should be at the breast 8-12 times in 24 hours or every 2-3 hours.  If nipple/aerola firm - prepump to soften for 3-5 minutes. Use #20 nipple shield to latch baby. Try to keep baby nursing for 15-20 minutes each breast. Post pump after feedings 15-20 minutes. Supplement each feeding 30-40 ml of breast milk or formula. If baby does not breastfeed- increase supplement to 60-90 ml of breast milk or formula each feeding. Lactation OP f/u Friday, 06/26/14 at 1:00 pm.

## 2014-06-22 ENCOUNTER — Encounter (HOSPITAL_COMMUNITY): Payer: Self-pay | Admitting: *Deleted

## 2014-06-26 ENCOUNTER — Ambulatory Visit (HOSPITAL_COMMUNITY): Admission: RE | Admit: 2014-06-26 | Payer: Managed Care, Other (non HMO) | Source: Ambulatory Visit

## 2014-08-26 IMAGING — US US SOFT TISSUE HEAD/NECK
1 series · 14 of 25 positions shown · non-contrast
Comparison: None.

CLINICAL DATA: Goiter.  Elevated TSH

EXAM:
THYROID ULTRASOUND
TECHNIQUE: Ultrasound examination of the thyroid gland and adjacent soft
tissues was performed.

[Series 1: us soft tissue head/neck · 0.09mm/px · 14 of 60 slices shown]
[im 1/60]
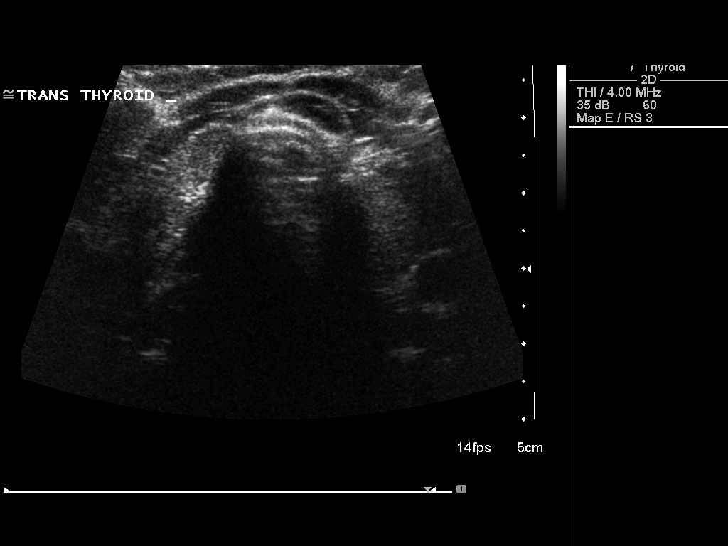
[im 5/60]
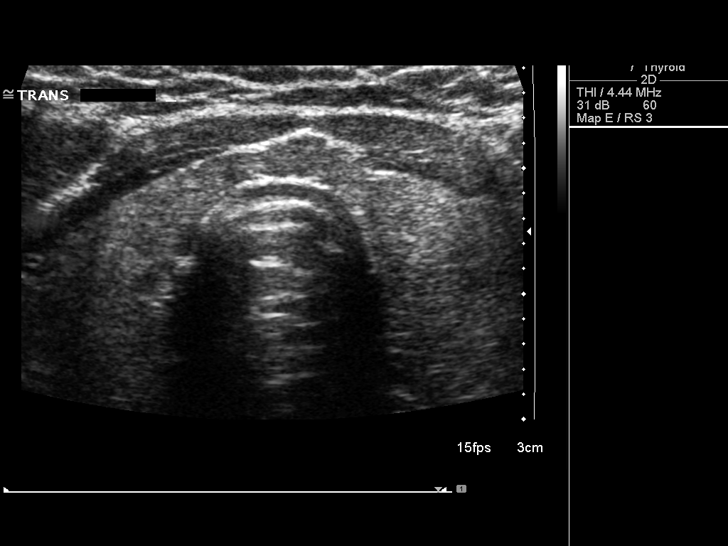
[im 10/60]
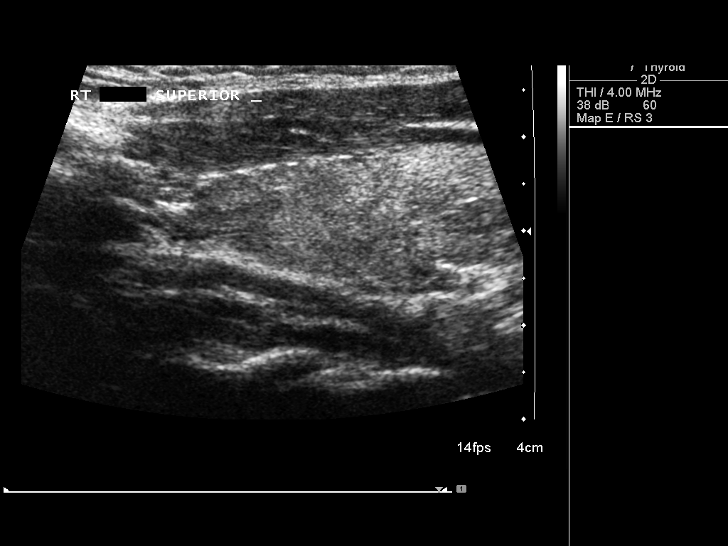
[im 15/60]
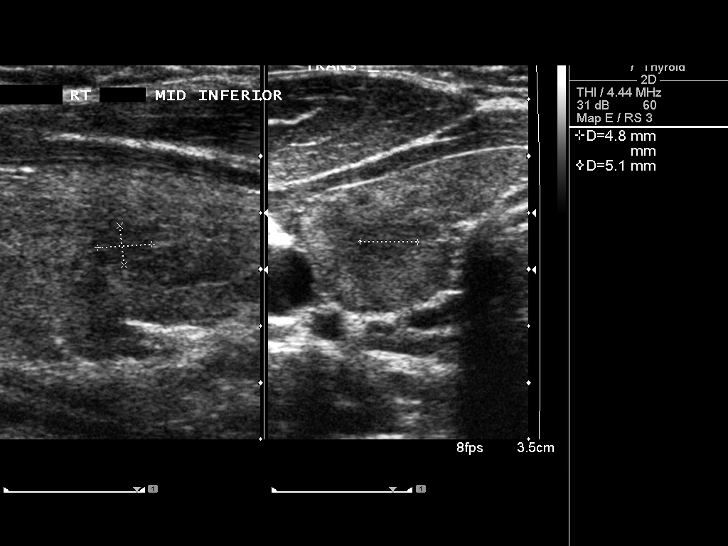
[im 20/60]
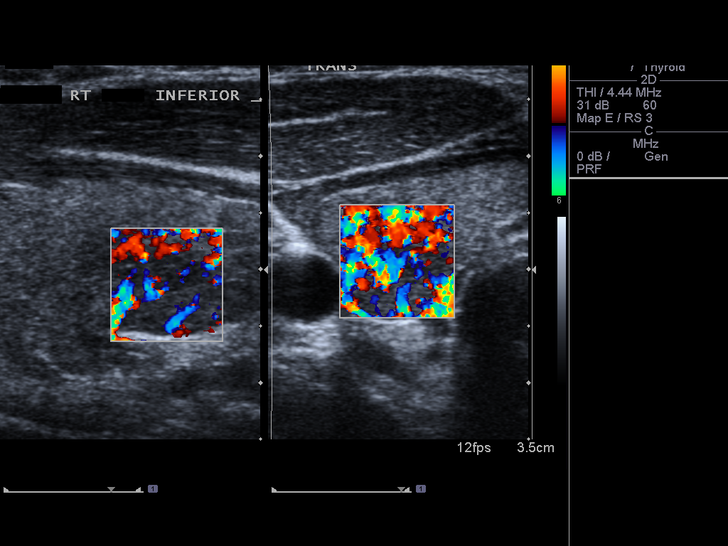
[im 23/60]
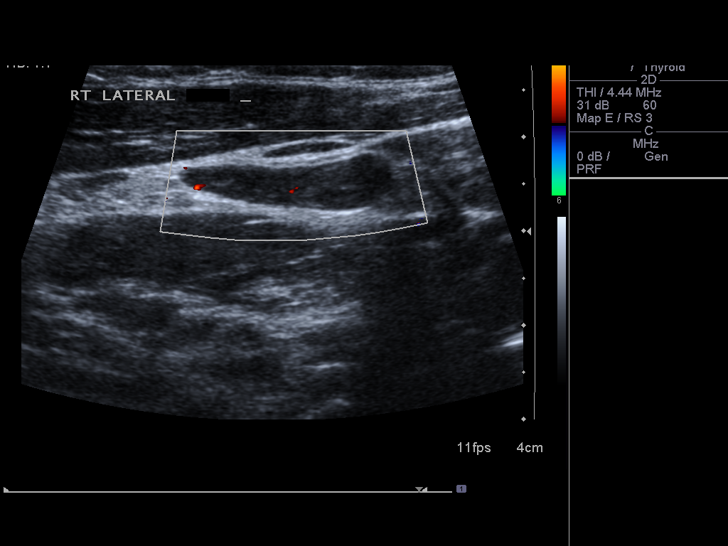
[im 28/60]
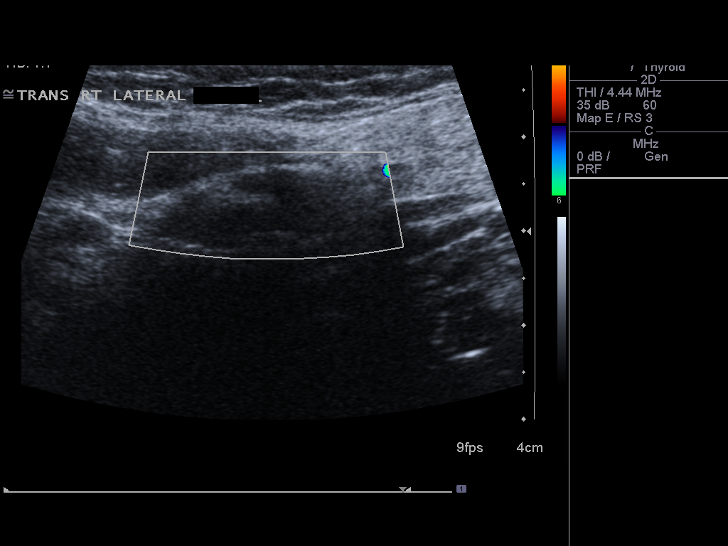
[im 32/60]
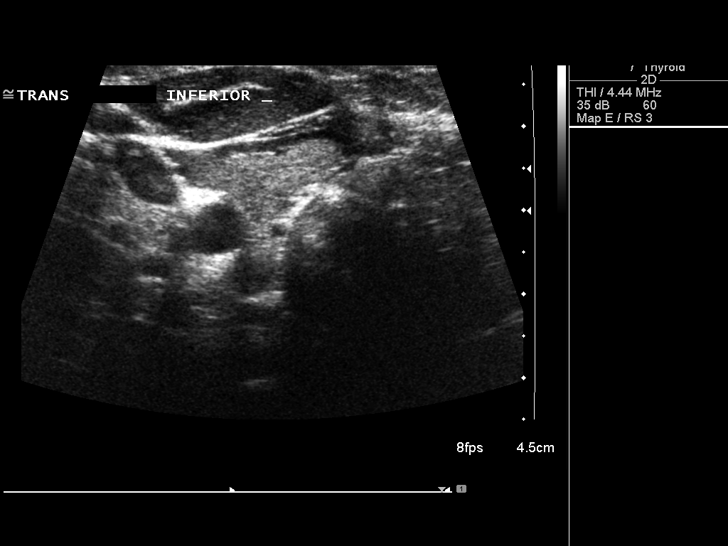
[im 37/60]
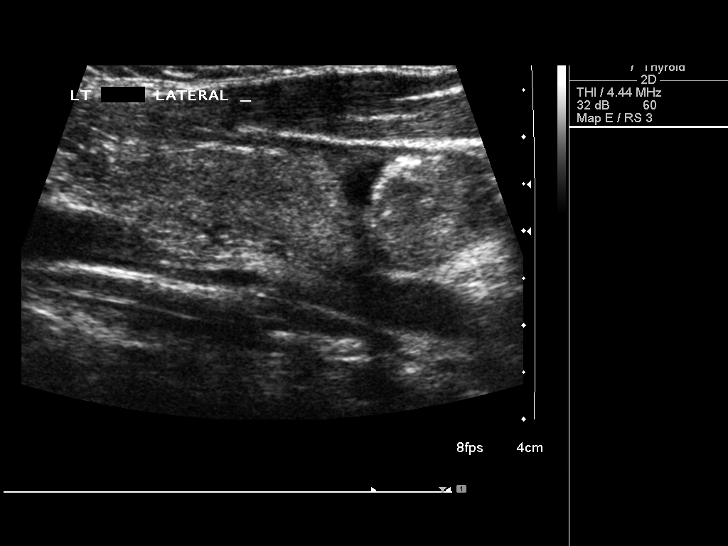
[im 40/60]
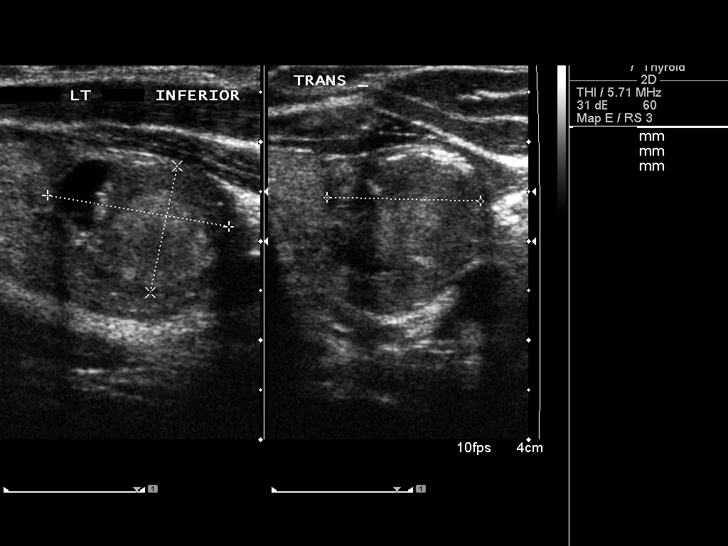
[im 45/60]
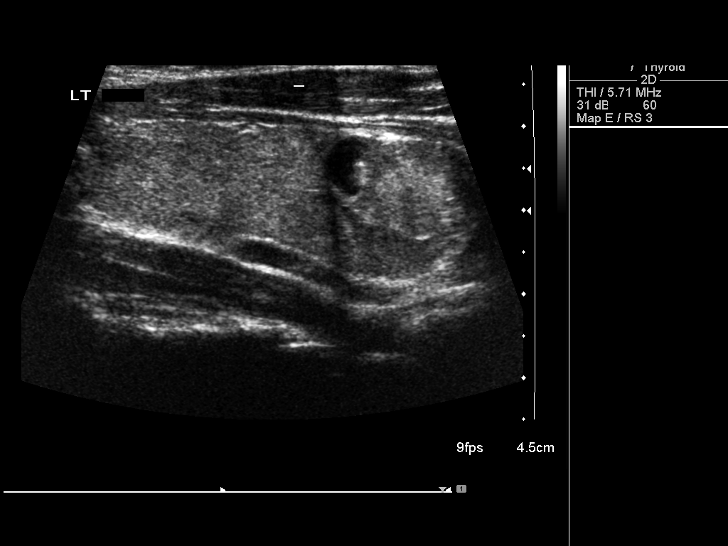
[im 50/60]
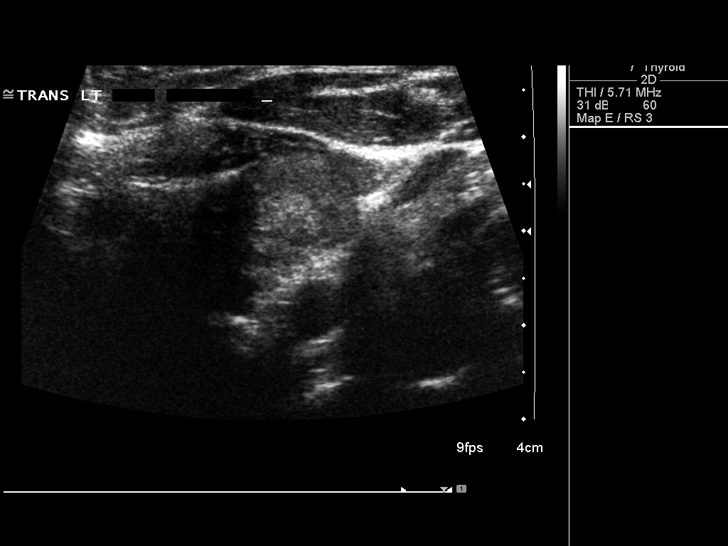
[im 55/60]
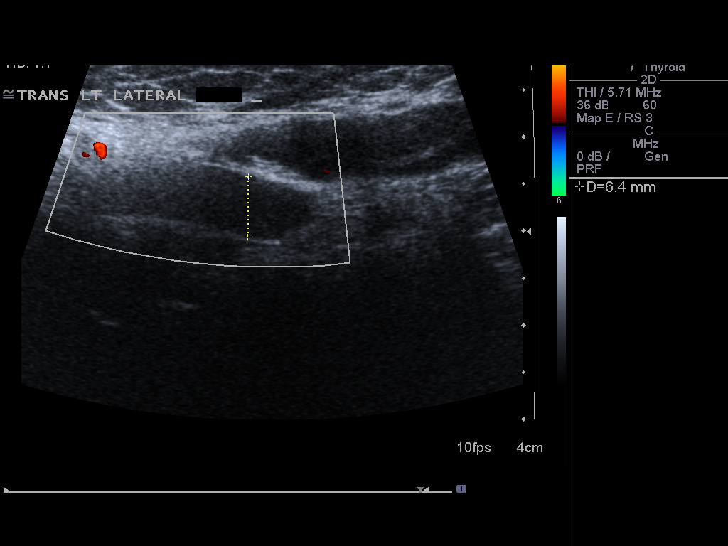
[im 60/60]
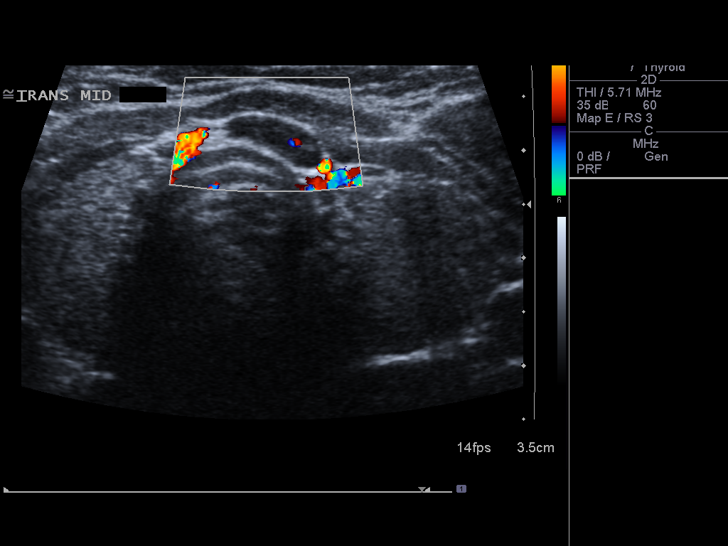

[14 of 25 positions shown; findings below may reference images not displayed]

FINDINGS: Right thyroid lobe

Measurements: 4.6 x 1.5 x 1.5 cm. Heterogeneous echotexture of the
right lobe. 5 mm solid nodules in the right lower pole.

Left thyroid lobe

Measurements: 4.7 x 1.7 x 2.2 cm. Heterogeneous echotexture.
Dominant nodule left lower pole measures 19 x 16 x 13 mm and appears
solid. There is a cystic component to the nodule well as coarse
calcifications. 5 mm left midpole nodule..

Isthmus

Thickness: 4 mm.  No nodules visualized.

Lymphadenopathy

None visualized.
IMPRESSION: Dominant nodule left lower lobe pole. Nodule is solid with a cystic
component and calcifications. Ultrasound-guided biopsy suggested to
rule out neoplasm.

## 2014-11-06 ENCOUNTER — Other Ambulatory Visit: Payer: Self-pay | Admitting: Obstetrics & Gynecology

## 2014-11-06 DIAGNOSIS — E049 Nontoxic goiter, unspecified: Secondary | ICD-10-CM

## 2014-11-30 ENCOUNTER — Other Ambulatory Visit: Payer: Managed Care, Other (non HMO)

## 2014-12-02 ENCOUNTER — Ambulatory Visit
Admission: RE | Admit: 2014-12-02 | Discharge: 2014-12-02 | Disposition: A | Discharge status: Home / Self Care | Payer: BLUE CROSS/BLUE SHIELD | Source: Ambulatory Visit | Attending: Obstetrics & Gynecology | Admitting: Obstetrics & Gynecology

## 2014-12-02 DIAGNOSIS — E049 Nontoxic goiter, unspecified: Secondary | ICD-10-CM

## 2016-09-28 DIAGNOSIS — E039 Hypothyroidism, unspecified: Secondary | ICD-10-CM | POA: Diagnosis not present

## 2016-09-28 DIAGNOSIS — Z6827 Body mass index (BMI) 27.0-27.9, adult: Secondary | ICD-10-CM | POA: Diagnosis not present

## 2016-09-28 DIAGNOSIS — L659 Nonscarring hair loss, unspecified: Secondary | ICD-10-CM | POA: Diagnosis not present

## 2016-09-28 DIAGNOSIS — E559 Vitamin D deficiency, unspecified: Secondary | ICD-10-CM | POA: Diagnosis not present

## 2016-11-14 DIAGNOSIS — E039 Hypothyroidism, unspecified: Secondary | ICD-10-CM | POA: Diagnosis not present

## 2016-11-14 DIAGNOSIS — E559 Vitamin D deficiency, unspecified: Secondary | ICD-10-CM | POA: Diagnosis not present

## 2016-11-16 DIAGNOSIS — E559 Vitamin D deficiency, unspecified: Secondary | ICD-10-CM | POA: Diagnosis not present

## 2016-11-16 DIAGNOSIS — E039 Hypothyroidism, unspecified: Secondary | ICD-10-CM | POA: Diagnosis not present

## 2016-11-16 DIAGNOSIS — Z6827 Body mass index (BMI) 27.0-27.9, adult: Secondary | ICD-10-CM | POA: Diagnosis not present

## 2016-11-16 DIAGNOSIS — L659 Nonscarring hair loss, unspecified: Secondary | ICD-10-CM | POA: Diagnosis not present

## 2016-11-23 ENCOUNTER — Other Ambulatory Visit: Payer: Self-pay | Admitting: Obstetrics & Gynecology

## 2016-11-23 DIAGNOSIS — E049 Nontoxic goiter, unspecified: Secondary | ICD-10-CM

## 2016-12-01 ENCOUNTER — Ambulatory Visit
Admission: RE | Admit: 2016-12-01 | Discharge: 2016-12-01 | Disposition: A | Discharge status: Home / Self Care | Payer: BLUE CROSS/BLUE SHIELD | Source: Ambulatory Visit | Attending: Obstetrics & Gynecology | Admitting: Obstetrics & Gynecology

## 2016-12-01 DIAGNOSIS — E041 Nontoxic single thyroid nodule: Secondary | ICD-10-CM | POA: Diagnosis not present

## 2016-12-01 DIAGNOSIS — E049 Nontoxic goiter, unspecified: Secondary | ICD-10-CM

## 2017-03-20 DIAGNOSIS — E039 Hypothyroidism, unspecified: Secondary | ICD-10-CM | POA: Diagnosis not present

## 2017-04-15 NOTE — Progress Notes (Signed)
Patient ID: Maria Hale, female   DOB: February 11, 1986, 31 y.o.   MRN: [id number]           Referring Physician: Shea Evans  Reason for Appointment:  Hypothyroidism, new visit    History of Present Illness:   Hypothyroidism was first diagnosed in 2015  At the time of diagnosis patient was probably being evaluated by her PCP for a left thyroid nodule At that time she was not having any symptoms of  fatigue, cold sensitivity, difficulty concentrating, dry skin, weight gain or hair loss per Baseline labs are not available.  Most likely she was started on 50 g daily at the time of diagnosis With this she did not feel any different.  In the first year she was also pregnant and she did not require any adjustment of her dosage during pregnancy  Subsequent details of her treatment are not available but her dose was increased at some time to take the 50 g daily 9 days a week instead of once a day TSH in 7/17 was normal done by gynecologist at 0.81  More recently patient was seen by her PCP in May but her labs are not available for review At that time she was complaining of persistent hair loss since her delivery in 2015 She was told by her PCP that she needed an increase in her dosage and was switched to 75 g.  With this the patient thinks that her hair loss improved  The patient is somewhat inconsistent with her history about her medication dosages but apparently after this dosage increase she had her TSH tested again in [place] and she was told that it was very low She stopped taking the medication for at least a week and then went back to taking 50 g 9 days a week as before TSH was rechecked by hergynecologist about 4 weeks ago and this was 6.1  Currently the patient feels a little good with no unusual fatigue, no significant hair loss, weight change, somnolence or difficulty concentrating No symptoms of cold intolerance  She is now referred here for further management            Patient's weight history is as follows:   Wt Readings from Last 3 Encounters:  04/16/17 157 lb 12.8 oz (71.6 kg)  06/08/14 169 lb (76.7 kg)    Thyroid function results have been as follows:  No results found for: TSH, FREET4, T3FREE  Left lower pole nodule measures up to 1.5 cm and previously measured up to 1.8 cm. This was previously biopsied in 2015.  Past Medical History:  Diagnosis Date  . Hypothyroidism     Past Surgical History:  Procedure Laterality Date  . NO PAST SURGERIES      No family history on file.  Social History:  reports that she has never smoked. She has never used smokeless tobacco. She reports that she does not drink alcohol or use drugs.  Allergies: No Known Allergies  Allergies as of 04/16/2017   No Known Allergies     Medication List       Accurate as of 04/16/17  4:35 PM. Always use your most recent med list.          levothyroxine 75 MCG tablet Commonly known as:  SYNTHROID, LEVOTHROID Take 75 mcg by mouth daily before breakfast.   multivitamin-prenatal 27-0.8 MG Tabs tablet Take 1 tablet by mouth daily at 12 noon.   Vitamin D3 5000 units Caps Take 1 capsule by mouth daily.  Review of Systems  Constitutional: Negative for weight loss.  HENT: Negative for trouble swallowing.   Cardiovascular: Negative for palpitations.  Gastrointestinal: Negative for constipation and abdominal pain.  Endocrine: Negative for menstrual changes and cold intolerance.  Musculoskeletal: Negative for joint pain.  Neurological: Negative for weakness and numbness.  Psychiatric/Behavioral: Negative for nervousness.     Wt Readings from Last 3 Encounters:  04/16/17 157 lb 12.8 oz (71.6 kg)  06/08/14 169 lb (76.7 kg)               Examination:     BP 114/74   Pulse 74   Ht 5' 0.25" (1.53 m)   Wt 157 lb 12.8 oz (71.6 kg)   LMP 03/30/2017   SpO2 98%   BMI 30.56 kg/m   GENERAL:  Average build. Mild generalized obesity  present  No pallor, clubbing, lymphadenopathy or edema.   Skin:  no rash or pigmentation. Hands are mildly diaphoretic  EYES:  No prominence of the eyes or swelling of the eyelids  ENT: Oral mucosa and tongue normal.  THYROID:  Not palpable on the right.  About a 1. 5-2 centimeters nodule felt on the left side mostly on swallowing and relatively deep-seated with smooth firm texture  HEART:  Normal  S1 and S2; no murmur or click.  CHEST:    Lungs: Vescicular breath sounds heard equally.  No crepitations/ wheeze.  ABDOMEN:  No distention.  Liver and spleen not palpable.   No other mass or tenderness.  NEUROLOGICAL: Reflexes are bilaterally normal to slightly brisk at biceps.  JOINTS:  Normal peripheral joints   Assessment:  HYPOTHYROIDISM of about 3 years duration She appears to have been asymptomatic both at diagnosis and subsequently even with her PCP and gynecologist increasing her doses Although she was having persistent hair loss earlier this year it is unclear if this is the only symptom she had with mild hypothyroidism in May She did not feel subjectively better otherwise with increasing the dose  Since she does not [place] with her taking 75 g not clear if she did actually have a low TSH on this With her being regular with her supplement her TSH was mildly increased about 4 weeks ago at 6.1  Objectively she looks euthyroid and she has no symptoms of hypothyroidism currently with her regimen of about 64 g daily on an average  THYROID nodule on the left: Although her pathology reported atypia and no clear diagnosis she does appear to have a smaller size nodule 3 years later on her ultrasound making it likely that this is a benign nodule and she will not need any further ultrasound follow-up  PLAN:   Discussed with the patient the etiology and management of autoimmune hypothyroidism an given her a handout on this Stressed to her the need for  lifelong supplementation and need for periodic adjustment of dosage  Check free T4 and TSH today and decide on her dosage  Follow-up in about 2 months   [facility] 04/16/2017, 4:35 PM   Consultation note copy sent to the PCP and gynecologist  Note: This office note was prepared with Dragon voice recognition system technology. Any transcriptional errors that result from this process are unintentional.

## 2017-04-16 ENCOUNTER — Ambulatory Visit (INDEPENDENT_AMBULATORY_CARE_PROVIDER_SITE_OTHER): Payer: BLUE CROSS/BLUE SHIELD | Admitting: Endocrinology

## 2017-04-16 ENCOUNTER — Encounter: Payer: Self-pay | Admitting: Endocrinology

## 2017-04-16 VITALS — BP 114/74 | HR 74 | Ht 60.25 in | Wt 157.8 lb

## 2017-04-16 DIAGNOSIS — E041 Nontoxic single thyroid nodule: Secondary | ICD-10-CM | POA: Diagnosis not present

## 2017-04-16 DIAGNOSIS — E063 Autoimmune thyroiditis: Secondary | ICD-10-CM | POA: Diagnosis not present

## 2017-04-17 LAB — T4, FREE: FREE T4: 0.63 ng/dL (ref 0.60–1.60)

## 2017-04-17 LAB — TSH: TSH: 16.66 u[IU]/mL — AB (ref 0.35–4.50)

## 2017-04-24 DIAGNOSIS — Z130 Encounter for screening for diseases of the blood and blood-forming organs and certain disorders involving the immune mechanism: Secondary | ICD-10-CM | POA: Diagnosis not present

## 2017-04-24 DIAGNOSIS — Z131 Encounter for screening for diabetes mellitus: Secondary | ICD-10-CM | POA: Diagnosis not present

## 2017-04-24 DIAGNOSIS — L659 Nonscarring hair loss, unspecified: Secondary | ICD-10-CM | POA: Diagnosis not present

## 2017-04-24 DIAGNOSIS — Z01411 Encounter for gynecological examination (general) (routine) with abnormal findings: Secondary | ICD-10-CM | POA: Diagnosis not present

## 2017-04-24 DIAGNOSIS — E039 Hypothyroidism, unspecified: Secondary | ICD-10-CM | POA: Diagnosis not present

## 2017-04-24 DIAGNOSIS — R635 Abnormal weight gain: Secondary | ICD-10-CM | POA: Diagnosis not present

## 2017-04-24 DIAGNOSIS — Z0000 Encounter for general adult medical examination without abnormal findings: Secondary | ICD-10-CM | POA: Diagnosis not present

## 2017-04-24 DIAGNOSIS — Z13220 Encounter for screening for lipoid disorders: Secondary | ICD-10-CM | POA: Diagnosis not present

## 2017-04-24 DIAGNOSIS — Z6830 Body mass index (BMI) 30.0-30.9, adult: Secondary | ICD-10-CM | POA: Diagnosis not present

## 2017-06-14 ENCOUNTER — Other Ambulatory Visit (INDEPENDENT_AMBULATORY_CARE_PROVIDER_SITE_OTHER): Payer: BLUE CROSS/BLUE SHIELD

## 2017-06-14 DIAGNOSIS — E063 Autoimmune thyroiditis: Secondary | ICD-10-CM

## 2017-06-14 LAB — TSH: TSH: 1.07 u[IU]/mL (ref 0.35–4.50)

## 2017-06-14 LAB — T4, FREE: FREE T4: 1.04 ng/dL (ref 0.60–1.60)

## 2017-06-18 ENCOUNTER — Encounter: Payer: Self-pay | Admitting: Endocrinology

## 2017-06-18 ENCOUNTER — Ambulatory Visit (INDEPENDENT_AMBULATORY_CARE_PROVIDER_SITE_OTHER): Payer: BLUE CROSS/BLUE SHIELD | Admitting: Endocrinology

## 2017-06-18 VITALS — BP 108/76 | HR 75 | Ht 60.25 in | Wt 154.4 lb

## 2017-06-18 DIAGNOSIS — E063 Autoimmune thyroiditis: Secondary | ICD-10-CM | POA: Diagnosis not present

## 2017-06-18 NOTE — Progress Notes (Signed)
Patient ID: Maria Hale, female   DOB: 1986/08/19, [age] y.o.   MRN: [id number]           Referring Physician: Shea EvansVaishali Mody  Reason for Appointment:  Hypothyroidism, follow-up visit    History of Present Illness:   Hypothyroidism was first diagnosed in 2015  Prior history: At the time of diagnosis patient was probably being evaluated by her PCP for a left thyroid nodule At that time Maria Hale was not having any symptoms of  fatigue, cold sensitivity, difficulty concentrating, dry skin, weight gain or hair loss per Baseline labs are not available.  But Maria Hale was started on 50 g daily at the time of diagnosis With this Maria Hale did not feel any different.  In the first year Maria Hale was also pregnant and Maria Hale did not require any adjustment of her dosage during pregnancy  Subsequent details of her treatment are not available but her dose was increased at some time to take the 50 g daily 9 days a week instead of once a day TSH in 7/17 was normal done by gynecologist at 0.81 At that time Maria Hale was complaining of persistent hair loss since her delivery in 2015 Maria Hale was told by her PCP that Maria Hale needed an increase in her dosage and was switched to 75 g.  With this the patient thinks that her hair loss improved The patient is somewhat inconsistent with her history about her medication dosages but apparently after this dosage increase Maria Hale had her TSH tested again in [place] and Maria Hale was told that it was very low Maria Hale stopped taking the medication for at least a week and then went back to taking 50 g 9 days a week as before  RECENT history:  On her initial visit her TSH was significantly high at 16.7 when Maria Hale was taking 50 g somewhat irregularly Maria Hale was told to increase her dose back to 75 g Maria Hale has not felt any different since her last visit  Currently the patient feels a little good with no fatigue, no significant hair loss Her weight has come down No symptoms of cold intolerance Maria Hale has taken her thyroid  supplement regularly before breakfast daily  TSH is back to normal         Patient's weight history is as follows:   Wt Readings from Last 3 Encounters:  06/18/17 154 lb 6.4 oz (70 kg)  04/16/17 157 lb 12.8 oz (71.6 kg)  06/08/14 169 lb (76.7 kg)    Thyroid function results have been as follows:  Lab Results  Component Value Date   TSH 1.07 06/14/2017   TSH 16.66 (H) 04/16/2017   FREET4 1.04 06/14/2017   FREET4 0.63 04/16/2017    THYROID NODULE:  Last ultrasound done in 11/2016 showed the following: Left lower pole nodule measures up to 1.5 cm and previously measured up to 1.8 cm. This was previously biopsied in 2015.  Past Medical History:  Diagnosis Date  . Hypothyroidism     Past Surgical History:  Procedure Laterality Date  . NO PAST SURGERIES      Family History  Problem Relation Age of Onset  . Diabetes Mother   . Thyroid disease Maternal Uncle     Social History:  reports that Maria Hale has never smoked. Maria Hale has never used smokeless tobacco. Maria Hale reports that Maria Hale does not drink alcohol or use drugs.  Allergies: No Known Allergies  Allergies as of 06/18/2017   No Known Allergies     Medication List  Accurate as of 06/18/17  1:07 PM. Always use your most recent med list.          levothyroxine 75 MCG tablet Commonly known as:  SYNTHROID, LEVOTHROID Take 75 mcg by mouth daily before breakfast.   multivitamin-prenatal 27-0.8 MG Tabs tablet Take 1 tablet by mouth daily at 12 noon.   Vitamin D3 5000 units Caps Take 1 capsule by mouth daily.          Review of Systems                Examination:     BP 108/76   Pulse 75   Ht 5' 0.25" (1.53 m)   Wt 154 lb 6.4 oz (70 kg)   SpO2 99%   BMI 29.90 kg/m     THYROID:    Right thyroid lobe is enlarged about twice normal Left thyroid nodule is barely palpable on swallowing medially   Reflexes are normal to slightly brisk at biceps.    Assessment:  HYPOTHYROIDISM, primary  Maria Hale  appears to have been asymptomatic both at diagnosis and subsequently even with TSH is high as 16  Objectively Maria Hale looks euthyroid and Maria Hale has no symptoms of hypothyroidism currently, no new complaints of hair loss again Maria Hale is taking her thyroid supplement regularly as directed now  Her TSH is now quite normal with 75 mg levothyroxine and Maria Hale will continue this  THYROID nodule on the left: Benign on biopsy previously and not showing increase in size in April Maria Hale would have another ultrasound done in April 2019 for comparison  PLAN:   Discussed need to take thyroid supplement lifelong For now Maria Hale will continue the same dose of 75 g before breakfast without taking her vitamins at the same time  Follow-up in 6 months   Javae Braaten 06/18/2017, 1:07 PM     Note: This office note was prepared with Insurance underwriter. Any transcriptional errors that result from this process are unintentional.

## 2017-08-10 ENCOUNTER — Telehealth: Payer: Self-pay | Admitting: Endocrinology

## 2017-08-10 ENCOUNTER — Other Ambulatory Visit: Payer: Self-pay

## 2017-08-10 MED ORDER — LEVOTHYROXINE SODIUM 75 MCG PO TABS: 75.0000 ug | ORAL_TABLET | Freq: Every day | ORAL | 3 refills | Status: DC

## 2017-08-10 NOTE — Telephone Encounter (Signed)
I have sent the prescription in to the pharmacy for her.

## 2017-08-10 NOTE — Telephone Encounter (Signed)
[facility] the [facility].

## 2017-08-10 NOTE — Telephone Encounter (Signed)
Either [facility] pharmacy needs us to call in refill of levothyoxine or they need us to call in and ok a new manufacturer for the levothyroxine, the pt wasn't sure

## 2017-08-10 NOTE — Telephone Encounter (Signed)
Pt stated pharmacy faxed us a paper about this med  levothyroxine (SYNTHROID, LEVOTHROID) 75 MCG tablet  Pt is calling to see if we received this paper  Please advise

## 2017-12-06 ENCOUNTER — Other Ambulatory Visit: Payer: Self-pay | Admitting: Endocrinology

## 2017-12-06 MED ORDER — LEVOTHYROXINE SODIUM 75 MCG PO TABS: 75.0000 ug | ORAL_TABLET | Freq: Every day | ORAL | 0 refills | Status: DC

## 2017-12-06 NOTE — Telephone Encounter (Signed)
I called pt re: her upcoming OV with Dr. Lucianne MussKumar and refill request for Levothyroxine 75 mcg. She is requesting 30 day supply to be sent to [facility] in case the dose gets changed at her OV. Refill sent. See meds.

## 2017-12-06 NOTE — Telephone Encounter (Signed)
Patient stated someone called her from this office.

## 2017-12-14 ENCOUNTER — Other Ambulatory Visit (INDEPENDENT_AMBULATORY_CARE_PROVIDER_SITE_OTHER): Payer: BLUE CROSS/BLUE SHIELD

## 2017-12-14 DIAGNOSIS — E063 Autoimmune thyroiditis: Secondary | ICD-10-CM

## 2017-12-14 LAB — T4, FREE: FREE T4: 0.87 ng/dL (ref 0.60–1.60)

## 2017-12-14 LAB — TSH: TSH: 1.86 u[IU]/mL (ref 0.35–4.50)

## 2017-12-16 NOTE — Progress Notes (Signed)
Patient ID: Maria Hale, female   DOB: March 28, 1986, 32 y.o.   MRN: [id number]           Referring Physician: Shea Evans  Reason for Appointment:  Hypothyroidism, follow-up visit    History of Present Illness:   Hypothyroidism was first diagnosed in 2015  Prior history: At the time of diagnosis patient was probably being evaluated by her PCP for a left thyroid nodule At that time she was not having any symptoms of  fatigue, cold sensitivity, difficulty concentrating, dry skin, weight gain or hair loss per Baseline labs are not available.  But she was started on 50 g daily at the time of diagnosis With this she did not feel any different.  In the first year she was also pregnant and she did not require any adjustment of her dosage during pregnancy  Subsequent details of her treatment are not available but her dose was increased at some time to take the 50 g daily 9 days a week instead of once a day TSH in 7/17 was normal done by gynecologist at 0.81 At that time she was complaining of persistent hair loss since her delivery in 2015 She was told by her PCP that she needed an increase in her dosage and was switched to 75 g.  With this the patient thinks that her hair loss improved The patient is somewhat inconsistent with her history about her medication dosages but apparently after this dosage increase she had her TSH tested again in [place] and she was told that it was very low She stopped taking the medication for at least a week and then went back to taking 50 g 9 days a week as before  RECENT history:  On her initial visit her TSH was significantly high at 16.7 when she was taking 50 g somewhat irregularly She was told to increase her dose back to 75 g The dose was continued unchanged in 05/2017  No complaints of fatigue over the last 6 months and feels fairly good No significant weight change or cold intolerance or hair loss  She may be contemplating pregnancy at some  point in the future also  She has taken her thyroid supplement regularly before breakfast daily  TSH is again normal         Patient's weight history is as follows:   Wt Readings from Last 3 Encounters:  12/17/17 158 lb (71.7 kg)  06/18/17 154 lb 6.4 oz (70 kg)  04/16/17 157 lb 12.8 oz (71.6 kg)    Thyroid function results have been as follows:  Lab Results  Component Value Date   TSH 1.86 12/14/2017   TSH 1.07 06/14/2017   TSH 16.66 (H) 04/16/2017   FREET4 0.87 12/14/2017   FREET4 1.04 06/14/2017   FREET4 0.63 04/16/2017    THYROID NODULE:  Last ultrasound done in 11/2016 showed the following: Left lower pole nodule measures up to 1.5 cm and previously measured up to 1.8 cm. This was previously biopsied in 2015. However previously needle aspiration biopsy was indeterminate    Past Medical History:  Diagnosis Date  . Hypothyroidism     Past Surgical History:  Procedure Laterality Date  . NO PAST SURGERIES      Family History  Problem Relation Age of Onset  . Diabetes Mother   . Thyroid disease Maternal Uncle     Social History:  reports that she has never smoked. She has never used smokeless tobacco. She reports that she does not drink  alcohol or use drugs.  Allergies: No Known Allergies  Allergies as of 12/17/2017   No Known Allergies     Medication List        Accurate as of 12/17/17  1:12 PM. Always use your most recent med list.          levothyroxine 75 MCG tablet Commonly known as:  SYNTHROID, LEVOTHROID Take 1 tablet (75 mcg total) by mouth daily before breakfast.   Vitamin D3 5000 units Caps Take 1 capsule by mouth daily.          Review of Systems            She takes vitamin D but no other supplements   Examination:     BP 108/80 (BP Location: Left Arm, Patient Position: Sitting, Cuff Size: Normal)   Pulse 70   Ht 5' 0.25" (1.53 m)   Wt 158 lb (71.7 kg)   LMP 12/05/2017   SpO2 98%   BMI 30.60 kg/m     THYROID:     Right thyroid lobe is not clearly palpable Left thyroid nodule is barely palpable on swallowing medially   Normal right biceps reflex Skin appears normal    Assessment:  HYPOTHYROIDISM, primary  She has had only mild symptoms of fatigue both at diagnosis and subsequently even with TSH is high as 16 Currently is taking 75 mcg levothyroxine and the dose appears to be stable  She is doing well clinically No significant goiter TSH also stable in the normal range  THYROID nodule on the left:  Although she had a stable to reduce the size of the nodule last year her previous needle aspiration was indeterminate Clinically no change  PLAN:   Continue levothyroxine 75 mcg daily We will repeat ultrasound and if her nodule is again stable will not need any further exams  Follow-up in 6 months   Shatina Streets 12/17/2017, 1:12 PM     Note: This office note was prepared with Dragon voice recognition system technology. Any transcriptional errors that result from this process are unintentional.

## 2017-12-17 ENCOUNTER — Encounter: Payer: Self-pay | Admitting: Endocrinology

## 2017-12-17 ENCOUNTER — Ambulatory Visit (INDEPENDENT_AMBULATORY_CARE_PROVIDER_SITE_OTHER): Payer: BLUE CROSS/BLUE SHIELD | Admitting: Endocrinology

## 2017-12-17 VITALS — BP 108/80 | HR 70 | Ht 60.25 in | Wt 158.0 lb

## 2017-12-17 DIAGNOSIS — E041 Nontoxic single thyroid nodule: Secondary | ICD-10-CM | POA: Diagnosis not present

## 2017-12-17 DIAGNOSIS — E063 Autoimmune thyroiditis: Secondary | ICD-10-CM | POA: Diagnosis not present

## 2018-01-01 IMAGING — US US THYROID
1 series · 14 of 25 positions shown · non-contrast
Comparison: 12/02/2014

CLINICAL DATA: Prior ultrasound follow-up.  Followup goiter.

EXAM:
THYROID ULTRASOUND
TECHNIQUE: Ultrasound examination of the thyroid gland and adjacent soft
tissues was performed.

[Series 1: us thyroid · 0.06mm/px · 14 of 46 slices shown]
[im 1/46]
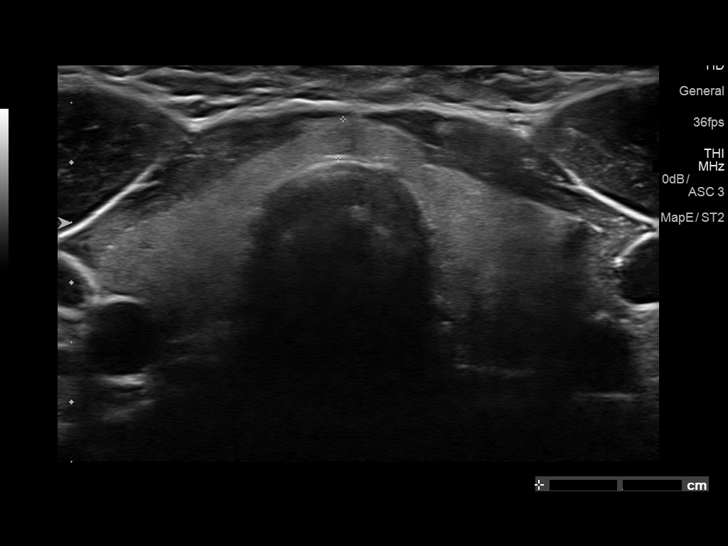
[im 4/46]
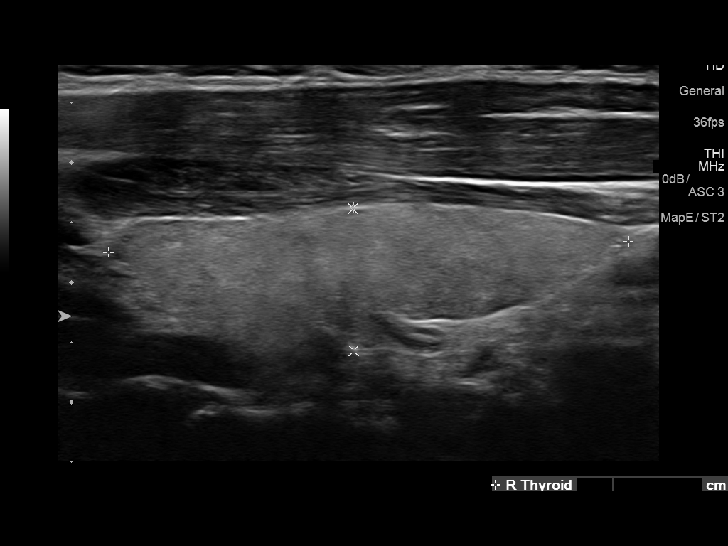
[im 8/46]
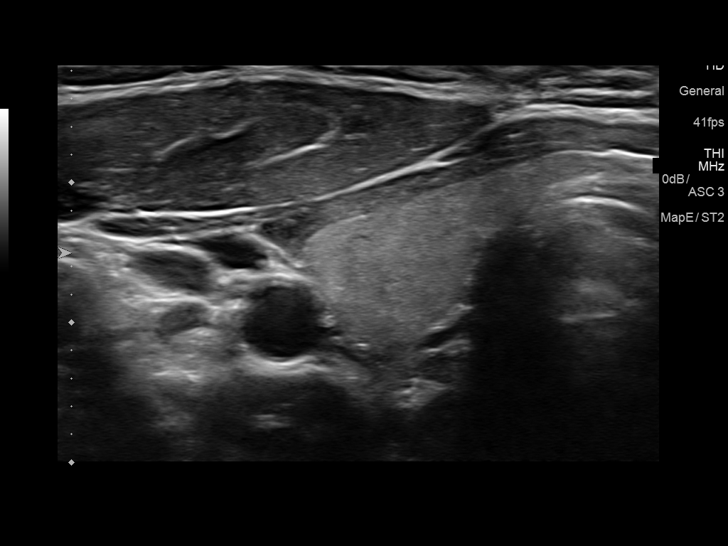
[im 12/46]
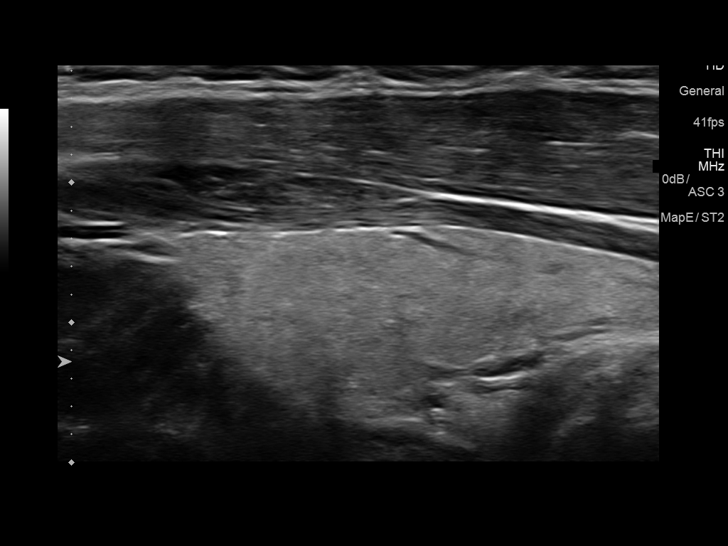
[im 16/46]
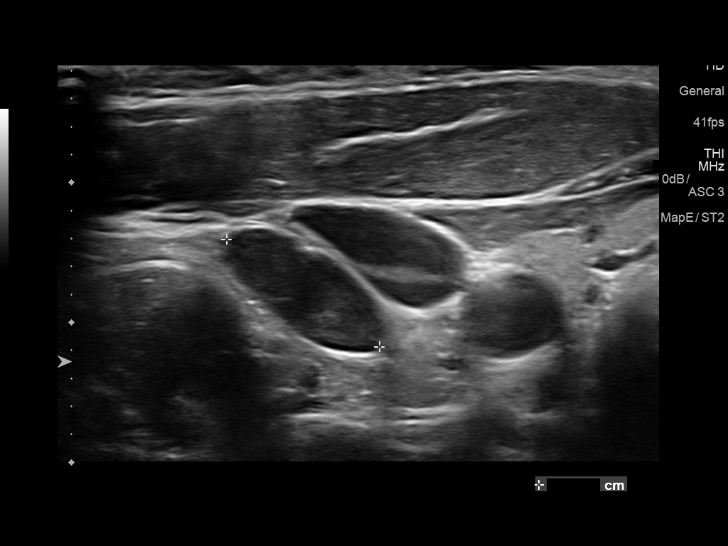
[im 17/46]
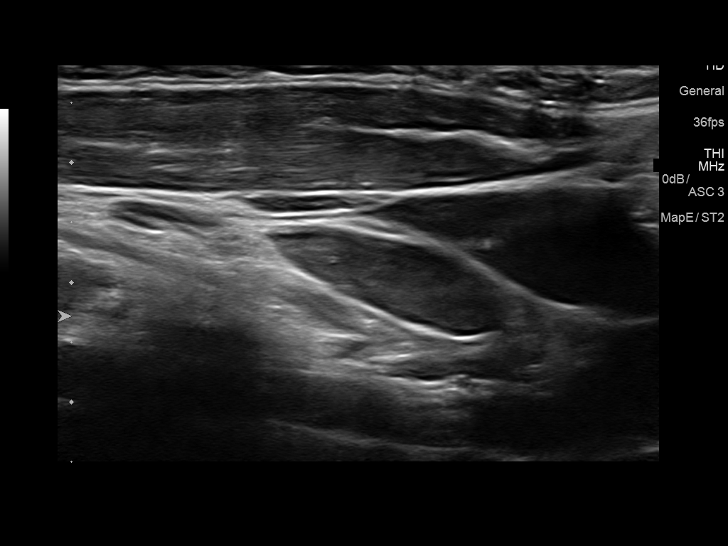
[im 21/46]
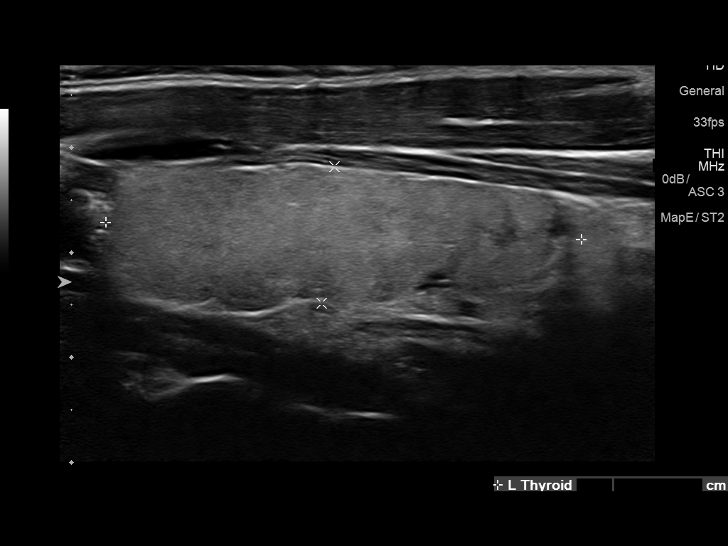
[im 25/46]
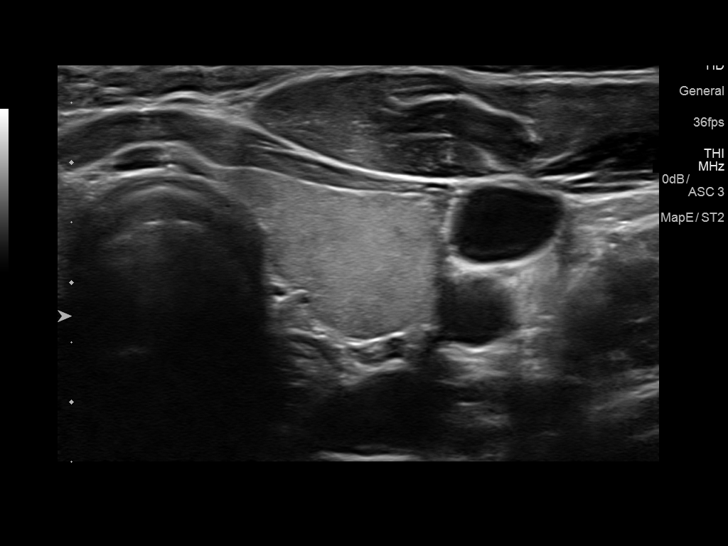
[im 29/46]
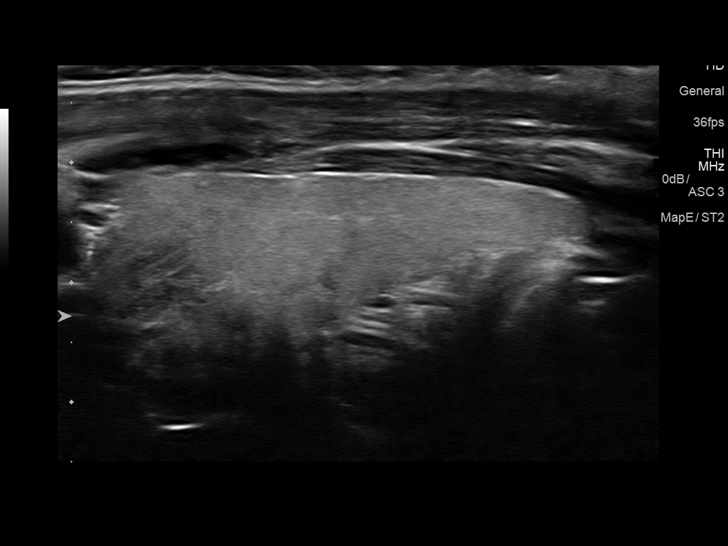
[im 31/46]
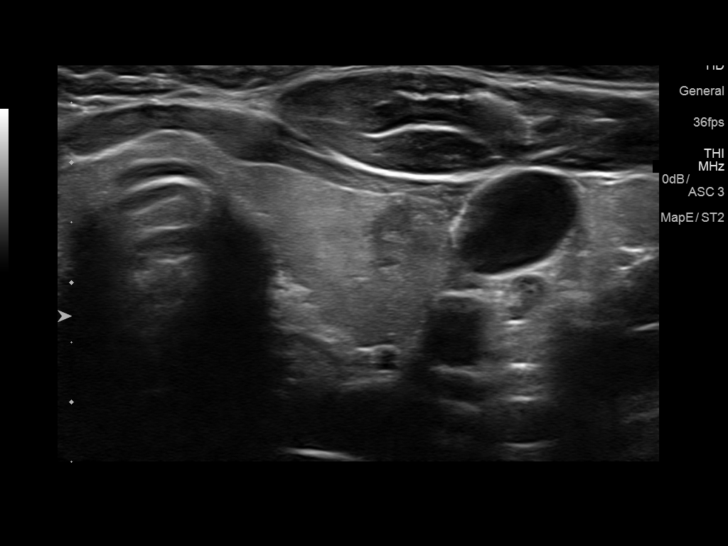
[im 34/46]
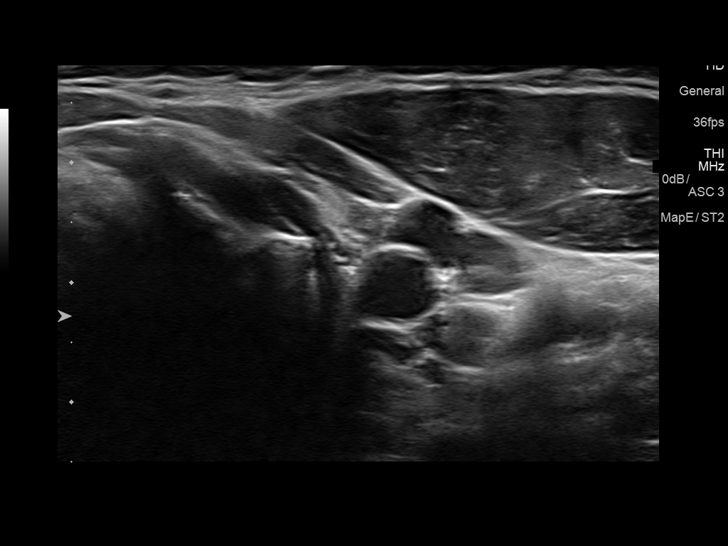
[im 38/46]
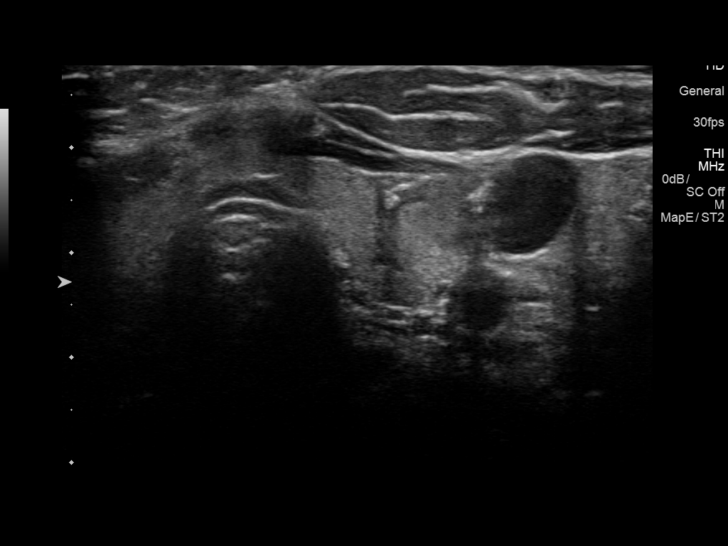
[im 42/46]
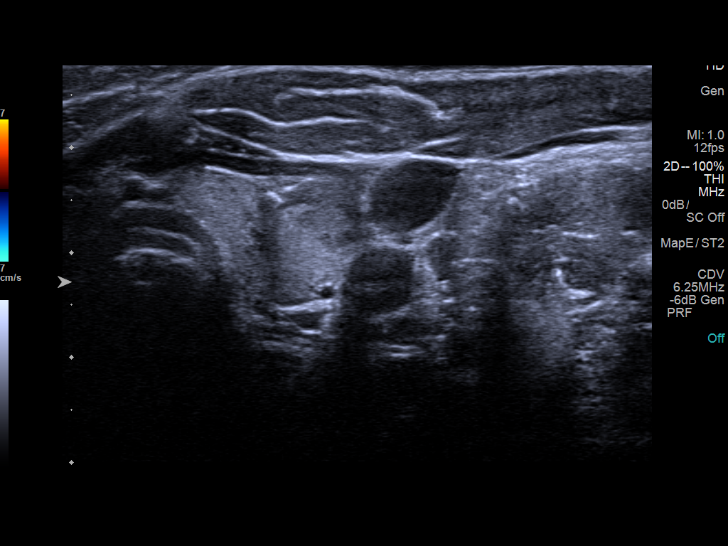
[im 46/46]
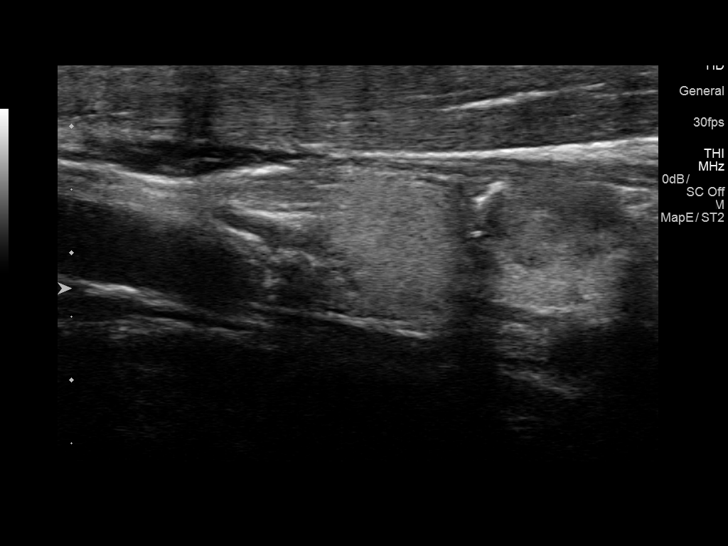

[14 of 25 positions shown; findings below may reference images not displayed]

FINDINGS: Parenchymal Echotexture: Mildly heterogenous

Isthmus: 0.3 cm, previously 0.3 cm

Right lobe: 4.3 x 1.2 x 1.2 cm, previously 4.5 x 1.6 x 1.6 cm

Left lobe: 4.5 x 1.3 x 1.5 cm, previously 5.0 x 1.5 x 2.1 cm

_________________________________________________________

Estimated total number of nodules >/= 1 cm: 1

Number of spongiform nodules >/=  2 cm not described below (TR1): 0

Number of mixed cystic and solid nodules >/= 1.5 cm not described
below (TR2): 0

_________________________________________________________

Left lower pole nodule measures up to 1.5 cm and previously measured
up to 1.8 cm. This was previously biopsied in 0641.
IMPRESSION: Dominant left lower pole nodule has slightly decreased in size.
Correlate with previous biopsy results.

The above is in keeping with the ACR TI-RADS recommendations - [HOSPITAL] 5569;[DATE].

## 2018-01-04 ENCOUNTER — Other Ambulatory Visit: Payer: Self-pay | Admitting: Endocrinology

## 2018-01-04 ENCOUNTER — Ambulatory Visit
Admission: RE | Admit: 2018-01-04 | Discharge: 2018-01-04 | Disposition: A | Discharge status: Home / Self Care | Payer: BLUE CROSS/BLUE SHIELD | Source: Ambulatory Visit | Attending: Endocrinology | Admitting: Endocrinology

## 2018-01-04 DIAGNOSIS — E042 Nontoxic multinodular goiter: Secondary | ICD-10-CM | POA: Diagnosis not present

## 2018-01-04 DIAGNOSIS — E063 Autoimmune thyroiditis: Secondary | ICD-10-CM

## 2018-01-15 ENCOUNTER — Other Ambulatory Visit: Payer: Self-pay

## 2018-01-15 ENCOUNTER — Telehealth: Payer: Self-pay | Admitting: Endocrinology

## 2018-01-15 MED ORDER — LEVOTHYROXINE SODIUM 75 MCG PO TABS: 75.0000 ug | ORAL_TABLET | Freq: Every day | ORAL | 0 refills | Status: DC

## 2018-01-15 NOTE — Telephone Encounter (Signed)
levothyroxine (SYNTHROID, LEVOTHROID) 75 MCG tablet   Patient stated refill request was being sent    [facility] [address], [place] - [address]

## 2018-01-15 NOTE — Telephone Encounter (Signed)
Medication sent to pharmacy per patient request.  ?

## 2018-01-16 ENCOUNTER — Telehealth: Payer: Self-pay | Admitting: Endocrinology

## 2018-01-16 ENCOUNTER — Other Ambulatory Visit: Payer: Self-pay

## 2018-01-16 MED ORDER — LEVOTHYROXINE SODIUM 75 MCG PO TABS: 75.0000 ug | ORAL_TABLET | Freq: Every day | ORAL | 0 refills | Status: DC

## 2018-01-16 NOTE — Telephone Encounter (Signed)
Medication sent to pharmacy per pt request.

## 2018-01-16 NOTE — Telephone Encounter (Signed)
   levothyroxine (SYNTHROID, LEVOTHROID) 75 MCG tablet   Patient [facility] the [facility] instead of the [facility] [address] [place], [place] - [address]

## 2018-02-17 ENCOUNTER — Other Ambulatory Visit: Payer: Self-pay | Admitting: Endocrinology

## 2018-05-20 ENCOUNTER — Other Ambulatory Visit: Payer: BLUE CROSS/BLUE SHIELD

## 2018-05-21 ENCOUNTER — Other Ambulatory Visit: Payer: Self-pay | Admitting: Endocrinology

## 2018-05-22 ENCOUNTER — Other Ambulatory Visit (INDEPENDENT_AMBULATORY_CARE_PROVIDER_SITE_OTHER): Payer: BLUE CROSS/BLUE SHIELD

## 2018-05-22 DIAGNOSIS — E041 Nontoxic single thyroid nodule: Secondary | ICD-10-CM

## 2018-05-22 LAB — TSH: TSH: 3.75 u[IU]/mL (ref 0.35–4.50)

## 2018-05-23 ENCOUNTER — Ambulatory Visit: Payer: BLUE CROSS/BLUE SHIELD | Admitting: Endocrinology

## 2018-05-24 ENCOUNTER — Encounter: Payer: Self-pay | Admitting: Endocrinology

## 2018-05-24 ENCOUNTER — Ambulatory Visit (INDEPENDENT_AMBULATORY_CARE_PROVIDER_SITE_OTHER): Payer: BLUE CROSS/BLUE SHIELD | Admitting: Endocrinology

## 2018-05-24 VITALS — BP 112/80 | HR 74 | Ht 63.0 in | Wt 159.0 lb

## 2018-05-24 DIAGNOSIS — E063 Autoimmune thyroiditis: Secondary | ICD-10-CM | POA: Diagnosis not present

## 2018-05-24 DIAGNOSIS — E041 Nontoxic single thyroid nodule: Secondary | ICD-10-CM | POA: Diagnosis not present

## 2018-05-24 NOTE — Progress Notes (Signed)
Patient ID: Maria Hale, female   DOB: 10/23/1985, 32 y.o.   MRN: [id number]           Referring Physician: Shea Evans  Reason for Appointment:  Hypothyroidism, follow-up visit    History of Present Illness:   Hypothyroidism was first diagnosed in 2015  Prior history: At the time of diagnosis patient was probably being evaluated by her PCP for a left thyroid nodule At that time she was not having any symptoms of  fatigue, cold sensitivity, difficulty concentrating, dry skin, weight gain or hair loss per Baseline labs are not available.  But she was started on 50 g daily at the time of diagnosis With this she did not feel any different.  In the first year she was also pregnant and she did not require any adjustment of her dosage during pregnancy  Subsequent details of her treatment are not available but her dose was increased at some time to take the 50 g daily 9 days a week instead of once a day TSH in 7/17 was normal done by gynecologist at 0.81 At that time she was complaining of persistent hair loss since her delivery in 2015 She was told by her PCP that she needed an increase in her dosage and was switched to 75 g.  With this the patient thinks that her hair loss improved The patient is somewhat inconsistent with her history about her medication dosages but apparently after this dosage increase she had her TSH tested again in [place] and she was told that it was very low She stopped taking the medication for at least a week and then went back to taking 50 g 9 days a week as before  RECENT history:  On her initial visit her TSH was significantly high at 16.7 when she was taking 50 g somewhat irregularly She was told to increase her dose back to 75 g With this her TSH had been consistently normal including on her last visit in 11/2017  She says she may occasionally feel a little fatigued, no cold intolerance, weight gain or hair loss Her TSH is 3.75 compared to 1.9 in  4/19  She has taken her thyroid supplement regularly before breakfast daily but has missed 1 dose this week while traveling before her labs  TSH is high normal         Patient's weight history is as follows:   Wt Readings from Last 3 Encounters:  05/24/18 159 lb (72.1 kg)  12/17/17 158 lb (71.7 kg)  06/18/17 154 lb 6.4 oz (70 kg)    Thyroid function results have been as follows:  Lab Results  Component Value Date   TSH 3.75 05/22/2018   TSH 1.86 12/14/2017   TSH 1.07 06/14/2017   TSH 16.66 (H) 04/16/2017   FREET4 0.87 12/14/2017   FREET4 1.04 06/14/2017   FREET4 0.63 04/16/2017    THYROID NODULE:  Last ultrasound done in 5/19 compared to the previous one in 11/2016 showed the following:  Left lower pole nodule measures 1.7 x 1.3 x 1.4 cm  and previously measured up to 1.9 cm. This was previously biopsied in 2015. previously needle aspiration biopsy was indeterminate  Nodule # 2: Location: Left; Inferior posterior  ACR TI-RADS total points: 4. Maximum size: 1.3 cm   Past Medical History:  Diagnosis Date  . Hypothyroidism     Past Surgical History:  Procedure Laterality Date  . NO PAST SURGERIES      Family History  Problem Relation  Age of Onset  . Diabetes Mother   . Thyroid disease Maternal Uncle     Social History:  reports that she has never smoked. She has never used smokeless tobacco. She reports that she does not drink alcohol or use drugs.  Allergies: No Known Allergies  Allergies as of 05/24/2018   No Known Allergies     Medication List        Accurate as of 05/24/18 10:39 AM. Always use your most recent med list.          levothyroxine 75 MCG tablet Commonly known as:  SYNTHROID, LEVOTHROID TAKE 1 TABLET BY MOUTH ONCE DAILY BEFORE BREAKFAST   Vitamin D3 5000 units Caps Take 1 capsule by mouth daily.          Review of Systems            She takes vitamin D but no other supplements   Examination:     BP 112/80   Pulse 74    Ht 5\' 3"  (1.6 m)   Wt 159 lb (72.1 kg)   SpO2 96%   BMI 28.17 kg/m     THYROID:    Right thyroid lobe is not palpable Left thyroid is palpable with difficulty on swallowing, has about a 1.5 cm slightly firm nodule felt   Normal biceps reflexs Skin normal    Assessment:  HYPOTHYROIDISM, primary  She has had only mild symptoms of fatigue both at diagnosis and subsequently even with TSH is high as 16 Currently is taking 75 mcg levothyroxine and the dose appears to be stable She has mild nonspecific fatigue Although her TSH is high normal this may have been related to her missing her dose before her labs    THYROID nodules on the left:  Clinically feels about the same Her previously biopsied nodule was stable in 5/19 but she had a relatively newer 1.3 cm nodule on the left side for which biopsy was not indicated  PLAN:   Continue levothyroxine 75 mcg daily We will repeat ultrasound in May 2020 again and if her nodule is again stable will not need any further exams  Follow-up in 6 months   Maria Hale 05/24/2018, 10:39 AM     Note: This office note was prepared with Dragon voice recognition system technology. Any transcriptional errors that result from this process are unintentional.

## 2018-06-08 ENCOUNTER — Other Ambulatory Visit: Payer: Self-pay | Admitting: Endocrinology

## 2018-06-17 ENCOUNTER — Telehealth: Payer: Self-pay | Admitting: Endocrinology

## 2018-06-17 NOTE — Telephone Encounter (Signed)
Per [facility] "Caller is out of her Levothyroxine 75 mg."

## 2018-06-18 ENCOUNTER — Other Ambulatory Visit: Payer: Self-pay | Admitting: Endocrinology

## 2018-06-18 MED ORDER — LEVOTHYROXINE SODIUM 75 MCG PO TABS: ORAL_TABLET | ORAL | 2 refills | Status: DC

## 2018-06-18 NOTE — Telephone Encounter (Signed)
Refilled Rx [facility]

## 2018-07-23 DIAGNOSIS — O26851 Spotting complicating pregnancy, first trimester: Secondary | ICD-10-CM | POA: Diagnosis not present

## 2018-07-23 DIAGNOSIS — Z3201 Encounter for pregnancy test, result positive: Secondary | ICD-10-CM | POA: Diagnosis not present

## 2018-07-23 DIAGNOSIS — Z3A01 Less than 8 weeks gestation of pregnancy: Secondary | ICD-10-CM | POA: Diagnosis not present

## 2018-08-16 DIAGNOSIS — Z3201 Encounter for pregnancy test, result positive: Secondary | ICD-10-CM | POA: Diagnosis not present

## 2018-08-16 DIAGNOSIS — Z23 Encounter for immunization: Secondary | ICD-10-CM | POA: Diagnosis not present

## 2018-08-21 NOTE — L&D Delivery Note (Signed)
Operative Delivery Note At 5:54 PM a viable and healthy female was delivered via  Presentation: vertex; Position: Right,, Occiput,, Anterior; Station: +5.  Delivery of the head: spontaneous. But as the head was delivering and was out up to 1/2 way she stopped pushing. Allowed mother to regain her focus and with better effort delivered the head. Shoulders didn't follow.  Room was prepared for shoulder dystocia in anticipation of LGA with AC at 99% First maneuver:McRobert's. Maternal effort was not adequate at this point.  ,   Second maneuver: One RN was advised to give suprapubic pressure as anterior shoulder was still above pubic symphysis and posterior was too far up to reach. With this, anterior shoulder was delivered while depressing the head down carefully without much traction. Posterior shoulder delivered soon after and the rest of the baby delivered easily.    APGAR: 8 ,9 ; weight  -pending.   Placenta status:spontaneous and complete, .   Cord:  with the following complications: None  Cord pH: sent   Anesthesia:  Epidural Episiotomy:  None Lacerations:  Perineal 2nd degree  Suture Repair: 3.0 vicryl rapide Est. Blood Loss (mL):  150 cc  Mom to postpartum.  Baby to Couplet care / Skin to Skin.  Elveria Royals 03/17/2019, 6:28 PM

## 2018-08-29 DIAGNOSIS — Z118 Encounter for screening for other infectious and parasitic diseases: Secondary | ICD-10-CM | POA: Diagnosis not present

## 2018-08-29 DIAGNOSIS — Z3A10 10 weeks gestation of pregnancy: Secondary | ICD-10-CM | POA: Diagnosis not present

## 2018-08-29 DIAGNOSIS — O99280 Endocrine, nutritional and metabolic diseases complicating pregnancy, unspecified trimester: Secondary | ICD-10-CM | POA: Diagnosis not present

## 2018-08-29 DIAGNOSIS — Z3481 Encounter for supervision of other normal pregnancy, first trimester: Secondary | ICD-10-CM | POA: Diagnosis not present

## 2018-08-29 DIAGNOSIS — Z3689 Encounter for other specified antenatal screening: Secondary | ICD-10-CM | POA: Diagnosis not present

## 2018-08-29 DIAGNOSIS — E559 Vitamin D deficiency, unspecified: Secondary | ICD-10-CM | POA: Diagnosis not present

## 2018-08-29 DIAGNOSIS — Z124 Encounter for screening for malignant neoplasm of cervix: Secondary | ICD-10-CM | POA: Diagnosis not present

## 2018-08-29 LAB — OB RESULTS CONSOLE RPR: RPR: NONREACTIVE

## 2018-08-29 LAB — OB RESULTS CONSOLE HEPATITIS B SURFACE ANTIGEN: Hepatitis B Surface Ag: NEGATIVE

## 2018-08-29 LAB — OB RESULTS CONSOLE ABO/RH: RH Type: POSITIVE

## 2018-08-29 LAB — OB RESULTS CONSOLE GC/CHLAMYDIA
Chlamydia: NEGATIVE
Gonorrhea: NEGATIVE

## 2018-08-29 LAB — OB RESULTS CONSOLE HIV ANTIBODY (ROUTINE TESTING): HIV: NONREACTIVE

## 2018-08-29 LAB — OB RESULTS CONSOLE RUBELLA ANTIBODY, IGM: Rubella: IMMUNE

## 2018-08-29 LAB — OB RESULTS CONSOLE ANTIBODY SCREEN: Antibody Screen: NEGATIVE

## 2018-09-20 DIAGNOSIS — O99280 Endocrine, nutritional and metabolic diseases complicating pregnancy, unspecified trimester: Secondary | ICD-10-CM | POA: Diagnosis not present

## 2018-09-20 DIAGNOSIS — O418X10 Other specified disorders of amniotic fluid and membranes, first trimester, not applicable or unspecified: Secondary | ICD-10-CM | POA: Diagnosis not present

## 2018-09-20 DIAGNOSIS — Z3A13 13 weeks gestation of pregnancy: Secondary | ICD-10-CM | POA: Diagnosis not present

## 2018-10-10 ENCOUNTER — Other Ambulatory Visit: Payer: Self-pay

## 2018-10-10 MED ORDER — LEVOTHYROXINE SODIUM 75 MCG PO TABS: ORAL_TABLET | ORAL | 0 refills | Status: DC

## 2018-10-15 DIAGNOSIS — O99281 Endocrine, nutritional and metabolic diseases complicating pregnancy, first trimester: Secondary | ICD-10-CM | POA: Diagnosis not present

## 2018-10-15 DIAGNOSIS — Z361 Encounter for antenatal screening for raised alphafetoprotein level: Secondary | ICD-10-CM | POA: Diagnosis not present

## 2018-10-15 DIAGNOSIS — Z3A17 17 weeks gestation of pregnancy: Secondary | ICD-10-CM | POA: Diagnosis not present

## 2018-11-01 DIAGNOSIS — Z363 Encounter for antenatal screening for malformations: Secondary | ICD-10-CM | POA: Diagnosis not present

## 2018-11-01 DIAGNOSIS — Z3686 Encounter for antenatal screening for cervical length: Secondary | ICD-10-CM | POA: Diagnosis not present

## 2018-11-01 DIAGNOSIS — Z3A19 19 weeks gestation of pregnancy: Secondary | ICD-10-CM | POA: Diagnosis not present

## 2018-11-01 DIAGNOSIS — O99282 Endocrine, nutritional and metabolic diseases complicating pregnancy, second trimester: Secondary | ICD-10-CM | POA: Diagnosis not present

## 2018-11-26 DIAGNOSIS — Z3A23 23 weeks gestation of pregnancy: Secondary | ICD-10-CM | POA: Diagnosis not present

## 2018-11-26 DIAGNOSIS — O99280 Endocrine, nutritional and metabolic diseases complicating pregnancy, unspecified trimester: Secondary | ICD-10-CM | POA: Diagnosis not present

## 2018-11-26 DIAGNOSIS — O99282 Endocrine, nutritional and metabolic diseases complicating pregnancy, second trimester: Secondary | ICD-10-CM | POA: Diagnosis not present

## 2018-12-19 ENCOUNTER — Other Ambulatory Visit: Payer: BLUE CROSS/BLUE SHIELD

## 2018-12-23 ENCOUNTER — Ambulatory Visit: Payer: BLUE CROSS/BLUE SHIELD | Admitting: Endocrinology

## 2018-12-25 DIAGNOSIS — Z23 Encounter for immunization: Secondary | ICD-10-CM | POA: Diagnosis not present

## 2018-12-25 DIAGNOSIS — O99282 Endocrine, nutritional and metabolic diseases complicating pregnancy, second trimester: Secondary | ICD-10-CM | POA: Diagnosis not present

## 2018-12-25 DIAGNOSIS — Z3689 Encounter for other specified antenatal screening: Secondary | ICD-10-CM | POA: Diagnosis not present

## 2018-12-25 DIAGNOSIS — Z3A27 27 weeks gestation of pregnancy: Secondary | ICD-10-CM | POA: Diagnosis not present

## 2018-12-26 DIAGNOSIS — R109 Unspecified abdominal pain: Secondary | ICD-10-CM | POA: Diagnosis not present

## 2018-12-26 DIAGNOSIS — Z3A37 37 weeks gestation of pregnancy: Secondary | ICD-10-CM | POA: Diagnosis not present

## 2019-01-02 DIAGNOSIS — Z3A28 28 weeks gestation of pregnancy: Secondary | ICD-10-CM | POA: Diagnosis not present

## 2019-01-02 DIAGNOSIS — O99282 Endocrine, nutritional and metabolic diseases complicating pregnancy, second trimester: Secondary | ICD-10-CM | POA: Diagnosis not present

## 2019-01-16 DIAGNOSIS — Z3A30 30 weeks gestation of pregnancy: Secondary | ICD-10-CM | POA: Diagnosis not present

## 2019-01-16 DIAGNOSIS — O99283 Endocrine, nutritional and metabolic diseases complicating pregnancy, third trimester: Secondary | ICD-10-CM | POA: Diagnosis not present

## 2019-01-16 DIAGNOSIS — Z3689 Encounter for other specified antenatal screening: Secondary | ICD-10-CM | POA: Diagnosis not present

## 2019-01-30 DIAGNOSIS — Z3A32 32 weeks gestation of pregnancy: Secondary | ICD-10-CM | POA: Diagnosis not present

## 2019-01-30 DIAGNOSIS — O99283 Endocrine, nutritional and metabolic diseases complicating pregnancy, third trimester: Secondary | ICD-10-CM | POA: Diagnosis not present

## 2019-02-12 DIAGNOSIS — O99283 Endocrine, nutritional and metabolic diseases complicating pregnancy, third trimester: Secondary | ICD-10-CM | POA: Diagnosis not present

## 2019-02-12 DIAGNOSIS — Z3A34 34 weeks gestation of pregnancy: Secondary | ICD-10-CM | POA: Diagnosis not present

## 2019-02-12 DIAGNOSIS — O99280 Endocrine, nutritional and metabolic diseases complicating pregnancy, unspecified trimester: Secondary | ICD-10-CM | POA: Diagnosis not present

## 2019-02-26 DIAGNOSIS — Z3685 Encounter for antenatal screening for Streptococcus B: Secondary | ICD-10-CM | POA: Diagnosis not present

## 2019-02-26 DIAGNOSIS — Z3A36 36 weeks gestation of pregnancy: Secondary | ICD-10-CM | POA: Diagnosis not present

## 2019-02-26 DIAGNOSIS — O99283 Endocrine, nutritional and metabolic diseases complicating pregnancy, third trimester: Secondary | ICD-10-CM | POA: Diagnosis not present

## 2019-02-26 LAB — OB RESULTS CONSOLE GBS: GBS: NEGATIVE

## 2019-03-05 DIAGNOSIS — O99283 Endocrine, nutritional and metabolic diseases complicating pregnancy, third trimester: Secondary | ICD-10-CM | POA: Diagnosis not present

## 2019-03-05 DIAGNOSIS — Z3A37 37 weeks gestation of pregnancy: Secondary | ICD-10-CM | POA: Diagnosis not present

## 2019-03-07 ENCOUNTER — Encounter (HOSPITAL_COMMUNITY): Payer: Self-pay | Admitting: *Deleted

## 2019-03-07 ENCOUNTER — Telehealth (HOSPITAL_COMMUNITY): Payer: Self-pay | Admitting: *Deleted

## 2019-03-07 NOTE — Telephone Encounter (Signed)
Preadmission screen  

## 2019-03-11 DIAGNOSIS — O99283 Endocrine, nutritional and metabolic diseases complicating pregnancy, third trimester: Secondary | ICD-10-CM | POA: Diagnosis not present

## 2019-03-11 DIAGNOSIS — Z3A38 38 weeks gestation of pregnancy: Secondary | ICD-10-CM | POA: Diagnosis not present

## 2019-03-13 ENCOUNTER — Other Ambulatory Visit: Payer: Self-pay

## 2019-03-13 ENCOUNTER — Other Ambulatory Visit (HOSPITAL_COMMUNITY)
Admission: RE | Admit: 2019-03-13 | Discharge: 2019-03-13 | Disposition: A | Discharge status: Home / Self Care | Payer: BC Managed Care – PPO | Source: Ambulatory Visit | Attending: Obstetrics & Gynecology | Admitting: Obstetrics & Gynecology

## 2019-03-13 DIAGNOSIS — Z1159 Encounter for screening for other viral diseases: Secondary | ICD-10-CM | POA: Diagnosis not present

## 2019-03-13 LAB — SARS CORONAVIRUS 2 (TAT 6-24 HRS): SARS Coronavirus 2: NEGATIVE

## 2019-03-13 NOTE — MAU Note (Signed)
Asymptomatic, swab collected. 

## 2019-03-14 ENCOUNTER — Other Ambulatory Visit: Payer: Self-pay | Admitting: Obstetrics & Gynecology

## 2019-03-17 ENCOUNTER — Inpatient Hospital Stay (HOSPITAL_COMMUNITY): Payer: BC Managed Care – PPO | Admitting: Anesthesiology

## 2019-03-17 ENCOUNTER — Encounter (HOSPITAL_COMMUNITY): Payer: Self-pay | Admitting: *Deleted

## 2019-03-17 ENCOUNTER — Inpatient Hospital Stay (HOSPITAL_COMMUNITY): Payer: BC Managed Care – PPO

## 2019-03-17 ENCOUNTER — Other Ambulatory Visit: Payer: Self-pay

## 2019-03-17 ENCOUNTER — Inpatient Hospital Stay (HOSPITAL_COMMUNITY)
Admission: AD | Admit: 2019-03-17 | Discharge: 2019-03-19 | DRG: 806 | Disposition: A | Discharge status: Home / Self Care | Payer: BC Managed Care – PPO | Attending: Obstetrics & Gynecology | Admitting: Obstetrics & Gynecology

## 2019-03-17 DIAGNOSIS — E039 Hypothyroidism, unspecified: Secondary | ICD-10-CM | POA: Diagnosis present

## 2019-03-17 DIAGNOSIS — O3663X0 Maternal care for excessive fetal growth, third trimester, not applicable or unspecified: Secondary | ICD-10-CM | POA: Diagnosis not present

## 2019-03-17 DIAGNOSIS — O9081 Anemia of the puerperium: Secondary | ICD-10-CM | POA: Diagnosis not present

## 2019-03-17 DIAGNOSIS — O26893 Other specified pregnancy related conditions, third trimester: Secondary | ICD-10-CM | POA: Diagnosis not present

## 2019-03-17 DIAGNOSIS — Z3490 Encounter for supervision of normal pregnancy, unspecified, unspecified trimester: Secondary | ICD-10-CM | POA: Diagnosis present

## 2019-03-17 DIAGNOSIS — Z3A39 39 weeks gestation of pregnancy: Secondary | ICD-10-CM | POA: Diagnosis not present

## 2019-03-17 DIAGNOSIS — D62 Acute posthemorrhagic anemia: Secondary | ICD-10-CM | POA: Diagnosis not present

## 2019-03-17 DIAGNOSIS — O99284 Endocrine, nutritional and metabolic diseases complicating childbirth: Secondary | ICD-10-CM | POA: Diagnosis not present

## 2019-03-17 DIAGNOSIS — O3660X0 Maternal care for excessive fetal growth, unspecified trimester, not applicable or unspecified: Secondary | ICD-10-CM | POA: Diagnosis present

## 2019-03-17 LAB — CBC
HCT: 41.8 % (ref 36.0–46.0)
Hemoglobin: 14.4 g/dL (ref 12.0–15.0)
MCH: 32.5 pg (ref 26.0–34.0)
MCHC: 34.4 g/dL (ref 30.0–36.0)
MCV: 94.4 fL (ref 80.0–100.0)
Platelets: 237 10*3/uL (ref 150–400)
RBC: 4.43 MIL/uL (ref 3.87–5.11)
RDW: 13.2 % (ref 11.5–15.5)
WBC: 9.6 10*3/uL (ref 4.0–10.5)
nRBC: 0 % (ref 0.0–0.2)

## 2019-03-17 LAB — TYPE AND SCREEN
ABO/RH(D): A POS
Antibody Screen: NEGATIVE

## 2019-03-17 LAB — RPR: RPR Ser Ql: NONREACTIVE

## 2019-03-17 MED ORDER — DIBUCAINE (PERIANAL) 1 % EX OINT: 1.0000 "application " | TOPICAL_OINTMENT | CUTANEOUS | Status: DC | PRN

## 2019-03-17 MED ORDER — ONDANSETRON HCL 4 MG/2ML IJ SOLN: 4.0000 mg | INTRAMUSCULAR | Status: DC | PRN

## 2019-03-17 MED ORDER — LIDOCAINE HCL (PF) 1 % IJ SOLN
INTRAMUSCULAR | Status: DC | PRN
  Administered 2019-03-17: 5 mL via EPIDURAL

## 2019-03-17 MED ORDER — ONDANSETRON HCL 4 MG PO TABS: 4.0000 mg | ORAL_TABLET | ORAL | Status: DC | PRN

## 2019-03-17 MED ORDER — TERBUTALINE SULFATE 1 MG/ML IJ SOLN: 0.2500 mg | Freq: Once | INTRAMUSCULAR | Status: DC | PRN

## 2019-03-17 MED ORDER — DIPHENHYDRAMINE HCL 25 MG PO CAPS: 25.0000 mg | ORAL_CAPSULE | Freq: Four times a day (QID) | ORAL | Status: DC | PRN

## 2019-03-17 MED ORDER — ACETAMINOPHEN 325 MG PO TABS: 650.0000 mg | ORAL_TABLET | ORAL | Status: DC | PRN

## 2019-03-17 MED ORDER — SIMETHICONE 80 MG PO CHEW: 80.0000 mg | CHEWABLE_TABLET | ORAL | Status: DC | PRN

## 2019-03-17 MED ORDER — OXYTOCIN 40 UNITS IN NORMAL SALINE INFUSION - SIMPLE MED
1.0000 m[IU]/min | INTRAVENOUS | Status: DC
  Administered 2019-03-17: 2 m[IU]/min via INTRAVENOUS
  Filled 2019-03-17: qty 1000

## 2019-03-17 MED ORDER — PRENATAL MULTIVITAMIN CH: 1.0000 | ORAL_TABLET | Freq: Every day | ORAL | Status: DC

## 2019-03-17 MED ORDER — IBUPROFEN 600 MG PO TABS
600.0000 mg | ORAL_TABLET | Freq: Four times a day (QID) | ORAL | Status: DC
  Administered 2019-03-17 – 2019-03-19 (×8): 600 mg via ORAL
  Filled 2019-03-17 (×8): qty 1

## 2019-03-17 MED ORDER — LACTATED RINGERS IV SOLN: 500.0000 mL | INTRAVENOUS | Status: DC | PRN

## 2019-03-17 MED ORDER — ONDANSETRON HCL 4 MG/2ML IJ SOLN: 4.0000 mg | Freq: Four times a day (QID) | INTRAMUSCULAR | Status: DC | PRN

## 2019-03-17 MED ORDER — WITCH HAZEL-GLYCERIN EX PADS: 1.0000 "application " | MEDICATED_PAD | CUTANEOUS | Status: DC | PRN

## 2019-03-17 MED ORDER — ZOLPIDEM TARTRATE 5 MG PO TABS: 5.0000 mg | ORAL_TABLET | Freq: Every evening | ORAL | Status: DC | PRN

## 2019-03-17 MED ORDER — TETANUS-DIPHTH-ACELL PERTUSSIS 5-2.5-18.5 LF-MCG/0.5 IM SUSP: 0.5000 mL | Freq: Once | INTRAMUSCULAR | Status: DC

## 2019-03-17 MED ORDER — SENNOSIDES-DOCUSATE SODIUM 8.6-50 MG PO TABS
2.0000 | ORAL_TABLET | ORAL | Status: DC
  Administered 2019-03-18 (×2): 2 via ORAL
  Filled 2019-03-17 (×2): qty 2

## 2019-03-17 MED ORDER — EPHEDRINE 5 MG/ML INJ: 10.0000 mg | INTRAVENOUS | Status: DC | PRN

## 2019-03-17 MED ORDER — OXYTOCIN 40 UNITS IN NORMAL SALINE INFUSION - SIMPLE MED: 2.5000 [IU]/h | INTRAVENOUS | Status: DC

## 2019-03-17 MED ORDER — SOD CITRATE-CITRIC ACID 500-334 MG/5ML PO SOLN: 30.0000 mL | ORAL | Status: DC | PRN

## 2019-03-17 MED ORDER — PHENYLEPHRINE 40 MCG/ML (10ML) SYRINGE FOR IV PUSH (FOR BLOOD PRESSURE SUPPORT): 80.0000 ug | PREFILLED_SYRINGE | INTRAVENOUS | Status: DC | PRN

## 2019-03-17 MED ORDER — FENTANYL-BUPIVACAINE-NACL 0.5-0.125-0.9 MG/250ML-% EP SOLN
12.0000 mL/h | EPIDURAL | Status: DC | PRN
  Filled 2019-03-17: qty 250

## 2019-03-17 MED ORDER — LACTATED RINGERS IV SOLN
INTRAVENOUS | Status: DC
  Administered 2019-03-17: 125 mL/h via INTRAVENOUS

## 2019-03-17 MED ORDER — PRENATAL MULTIVITAMIN CH
1.0000 | ORAL_TABLET | Freq: Every day | ORAL | Status: DC
  Administered 2019-03-18 – 2019-03-19 (×2): 1 via ORAL
  Filled 2019-03-17 (×2): qty 1

## 2019-03-17 MED ORDER — ACETAMINOPHEN 325 MG PO TABS
650.0000 mg | ORAL_TABLET | ORAL | Status: DC | PRN
  Administered 2019-03-18 – 2019-03-19 (×5): 650 mg via ORAL
  Filled 2019-03-17 (×5): qty 2

## 2019-03-17 MED ORDER — DIPHENHYDRAMINE HCL 50 MG/ML IJ SOLN: 12.5000 mg | INTRAMUSCULAR | Status: DC | PRN

## 2019-03-17 MED ORDER — SODIUM CHLORIDE (PF) 0.9 % IJ SOLN
INTRAMUSCULAR | Status: DC | PRN
  Administered 2019-03-17: 12 mL/h via EPIDURAL

## 2019-03-17 MED ORDER — OXYTOCIN BOLUS FROM INFUSION: 500.0000 mL | Freq: Once | INTRAVENOUS | Status: DC

## 2019-03-17 MED ORDER — COCONUT OIL OIL: 1.0000 "application " | TOPICAL_OIL | Status: DC | PRN

## 2019-03-17 MED ORDER — BENZOCAINE-MENTHOL 20-0.5 % EX AERO
1.0000 "application " | INHALATION_SPRAY | CUTANEOUS | Status: DC | PRN
  Administered 2019-03-17: 1 via TOPICAL
  Filled 2019-03-17: qty 56

## 2019-03-17 MED ORDER — LEVOTHYROXINE SODIUM 75 MCG PO TABS
75.0000 ug | ORAL_TABLET | Freq: Every day | ORAL | Status: DC
  Administered 2019-03-18 – 2019-03-19 (×2): 75 ug via ORAL
  Filled 2019-03-17 (×2): qty 1

## 2019-03-17 MED ORDER — LACTATED RINGERS IV SOLN: 500.0000 mL | Freq: Once | INTRAVENOUS | Status: DC

## 2019-03-17 MED ORDER — VITAMIN D3 125 MCG (5000 UT) PO CAPS: 1.0000 | ORAL_CAPSULE | Freq: Every day | ORAL | Status: DC

## 2019-03-17 MED ORDER — LIDOCAINE HCL (PF) 1 % IJ SOLN
30.0000 mL | INTRAMUSCULAR | Status: AC | PRN
  Administered 2019-03-17: 30 mL via SUBCUTANEOUS
  Filled 2019-03-17: qty 30

## 2019-03-17 NOTE — Anesthesia Preprocedure Evaluation (Signed)
Anesthesia Evaluation  Patient identified by MRN, date of birth, ID band Patient awake    Reviewed: Allergy & Precautions, Patient's Chart, lab work & pertinent test results  Airway Mallampati: II       Dental   Pulmonary    Pulmonary exam normal        Cardiovascular negative cardio ROS Normal cardiovascular exam     Neuro/Psych    GI/Hepatic Neg liver ROS,   Endo/Other  Hypothyroidism   Renal/GU      Musculoskeletal   Abdominal   Peds  Hematology negative hematology ROS (+)   Anesthesia Other Findings   Reproductive/Obstetrics (+) Pregnancy                             Anesthesia Physical Anesthesia Plan  ASA: II  Anesthesia Plan: Epidural   Post-op Pain Management:    Induction:   PONV Risk Score and Plan:   Airway Management Planned:   Additional Equipment:   Intra-op Plan:   Post-operative Plan:   Informed Consent: I have reviewed the patients History and Physical, chart, labs and discussed the procedure including the risks, benefits and alternatives for the proposed anesthesia with the patient or authorized representative who has indicated his/her understanding and acceptance.       Plan Discussed with:   Anesthesia Plan Comments: (Lab Results      Component                Value               Date                      WBC                      9.6                 03/17/2019                HGB                      14.4                03/17/2019                HCT                      41.8                03/17/2019                MCV                      94.4                03/17/2019                PLT                      237                 03/17/2019            COVID-19 Labs  No results for input(s): DDIMER, FERRITIN, LDH, CRP in the last 72 hours.  Lab Results      Component  Value               Date                      [id number]               NEGATIVE            03/13/2019            )        Anesthesia Quick Evaluation

## 2019-03-17 NOTE — Anesthesia Procedure Notes (Signed)
Epidural Patient location during procedure: OB Start time: 03/17/2019 3:01 PM End time: 03/17/2019 3:07 PM  Staffing Anesthesiologist: Effie Berkshire, MD Performed: anesthesiologist   Preanesthetic Checklist Completed: patient identified, site marked, surgical consent, pre-op evaluation, timeout performed, IV checked, risks and benefits discussed and monitors and equipment checked  Epidural Patient position: sitting Prep: ChloraPrep Patient monitoring: heart rate, continuous pulse ox and blood pressure Approach: midline Location: L3-L4 Injection technique: LOR saline  Needle:  Needle type: Tuohy  Needle gauge: 17 G Needle length: 9 cm Catheter type: closed end flexible Catheter size: 20 Guage Test dose: negative and 1.5% lidocaine  Assessment Events: blood not aspirated, injection not painful, no injection resistance and no paresthesia  Additional Notes LOR @ 5  Patient identified. Risks/Benefits/Options discussed with patient including but not limited to bleeding, infection, nerve damage, paralysis, failed block, incomplete pain control, headache, blood pressure changes, nausea, vomiting, reactions to medications, itching and postpartum back pain. Confirmed with bedside nurse the patient's most recent platelet count. Confirmed with patient that they are not currently taking any anticoagulation, have any bleeding history or any family history of bleeding disorders. Patient expressed understanding and wished to proceed. All questions were answered. Sterile technique was used throughout the entire procedure. Please see nursing notes for vital signs. Test dose was given through epidural catheter and negative prior to continuing to dose epidural or start infusion. Warning signs of high block given to the patient including shortness of breath, tingling/numbness in hands, complete motor block, or any concerning symptoms with instructions to call for help. Patient was given instructions on  fall risk and not to get out of bed. All questions and concerns addressed with instructions to call with any issues or inadequate analgesia.    Reason for block:procedure for pain

## 2019-03-17 NOTE — H&P (Addendum)
Maria Hale is a 33 y.o. female presenting for IOL at 59 wks. G2P1001, dated by 8 wk sono.  [facility]/ Dr Maria Hale Hypothyroidism, well controlled, NST reactive wkly from 36 wks. TWG 20 lbs only.  Growth LGA at 28 wks and 37 wks- 7'11" at 91% and AC 99%, Vx.  GBS(-).  Prior SVD 6'5"   OB History    Gravida  2   Para  1   Term      Preterm  1   AB      Living  1     SAB      TAB      Ectopic      Multiple      Live Births  1          Past Medical History:  Diagnosis Date  . Hypothyroidism    Past Surgical History:  Procedure Laterality Date  . NO PAST SURGERIES     Family History: family history includes Diabetes in her mother; Hyperlipidemia in her father; Hypertension in her mother; Ovarian cancer in her paternal grandmother; Thyroid disease in her maternal uncle. Social History:  reports that she has never smoked. She has never used smokeless tobacco. She reports that she does not drink alcohol or use drugs.     Maternal Diabetes: No Genetic Screening: Normal Maternal Ultrasounds/Referrals: Normal Fetal Ultrasounds or other Referrals:  None Maternal Substance Abuse:  No Significant Maternal Medications:  Meds include: Syntroid Significant Maternal Lab Results:  Group B Strep negative Other Comments:  None  ROS History Dilation: 6.5 Effacement (%): 90 Station: -3 Exam by:: Dr. Benjie Hale Blood pressure 124/78, pulse 90, temperature 98.6 F (37 C), temperature source Oral, resp. rate 16, height 5' (1.524 m), weight 82.6 kg, unknown if currently breastfeeding. Exam Physical Exam  Physical exam:  A&O x 3, no acute distress. Pleasant HEENT neg, no thyromegaly Lungs CTA bilat CV RRR, S1S2 normal Abdo soft, non tender, non acute Extr no edema/ tenderness Pelvic 6 cm/ AROM, clear, high station, RN maintained fundal pressure. FHT  140s + accels no decels mod variab- cat I Toco q2-3 min.  Prenatal labs: ABO, Rh: --/--/A POS, A POS Performed at  [facility], [place] [address], [place], [place] [address]  [phone]) Antibody: NEG (07/27 0810) Rubella: Immune (01/09 0000) RPR: Non Reactive (07/27 0800)  HBsAg: Negative (01/09 0000)  HIV: Non-reactive (01/09 0000)  GBS: Negative (07/08 0000)  Glucola nl   Assessment/Plan: G2P1001, 39 wks, IOL for LGA, prior SVD 6.5lbs now 8-8.1/2 lbs Pitocin, epidural okay, assess descent in active labor  GBS(-) MOD: Guarded    Maria Hale R Maria Hale 03/17/2019, 3:16 PM

## 2019-03-18 LAB — ABO/RH: ABO/RH(D): A POS

## 2019-03-18 LAB — CBC
HCT: 31.7 % — ABNORMAL LOW (ref 36.0–46.0)
Hemoglobin: 10.7 g/dL — ABNORMAL LOW (ref 12.0–15.0)
MCH: 32.6 pg (ref 26.0–34.0)
MCHC: 33.8 g/dL (ref 30.0–36.0)
MCV: 96.6 fL (ref 80.0–100.0)
Platelets: 207 10*3/uL (ref 150–400)
RBC: 3.28 MIL/uL — ABNORMAL LOW (ref 3.87–5.11)
RDW: 13.3 % (ref 11.5–15.5)
WBC: 14 10*3/uL — ABNORMAL HIGH (ref 4.0–10.5)
nRBC: 0 % (ref 0.0–0.2)

## 2019-03-18 MED ORDER — POLYSACCHARIDE IRON COMPLEX 150 MG PO CAPS
150.0000 mg | ORAL_CAPSULE | Freq: Every day | ORAL | Status: DC
  Administered 2019-03-18 – 2019-03-19 (×2): 150 mg via ORAL
  Filled 2019-03-18 (×2): qty 1

## 2019-03-18 MED ORDER — OXYCODONE HCL 5 MG PO TABS
5.0000 mg | ORAL_TABLET | ORAL | Status: DC | PRN
  Administered 2019-03-18 – 2019-03-19 (×5): 5 mg via ORAL
  Filled 2019-03-18 (×5): qty 1

## 2019-03-18 MED ORDER — MAGNESIUM OXIDE 400 (241.3 MG) MG PO TABS
400.0000 mg | ORAL_TABLET | Freq: Every day | ORAL | Status: DC
  Administered 2019-03-18 – 2019-03-19 (×2): 400 mg via ORAL
  Filled 2019-03-18 (×2): qty 1

## 2019-03-18 NOTE — Anesthesia Postprocedure Evaluation (Signed)
Anesthesia Post Note  Patient: Maria Hale  Procedure(s) Performed: AN AD [place]     Patient location during evaluation: Mother Baby Anesthesia Type: Epidural Level of consciousness: awake and alert Pain management: pain level controlled Vital Signs Assessment: post-procedure vital signs reviewed and stable Respiratory status: spontaneous breathing, nonlabored ventilation and respiratory function stable Cardiovascular status: stable Postop Assessment: no headache, no backache, epidural receding, no apparent nausea or vomiting, patient able to bend at knees, adequate PO intake and able to ambulate Anesthetic complications: no    Last Vitals:  Vitals:   03/18/19 0100 03/18/19 0500  BP: (!) 115/56 (!) 98/57  Pulse: 77 78  Resp: 16 16  Temp: (!) 36.4 C 36.7 C  SpO2: 100% 100%    Last Pain:  Vitals:   03/18/19 0500  TempSrc: Oral  PainSc: 4    Pain Goal:                   [employer]

## 2019-03-18 NOTE — Lactation Note (Addendum)
This note was copied from a baby's chart. Lactation Consultation Note  Patient Name: Girl Natausha Jungwirth [id number] Date: 03/18/2019 Reason for consult: Follow-up assessment P2, 26 hour female infant, weight loss -2%. Mom with hx of hypothyroidism  Per parents, infant had 2 voids and 3 stools since birth. Per mom, she breastfeed infant for 20 minutes at 1920 hours on her  left breast using the football hold position, LC did not observe latch at this time. Per mom,  she feels breastfeeding is going well,  but is concern she doesn't have  enough milk for baby. LC discussed  with parents infant's small tummy size, that colostrum is enough for infant  first  few days of life, if mom continue to breast feed latching infant to breast,  that her milk will come to volume within  3 to 5 days. LC discussed the law of supply and demand and encourage mom to continue with breastfeeding. LC reviewed hand expression and had mom taught  back hand expression,  colostrum is present in both breast and infant was given 2 ml of EBM on spoon. Mom was doing STS as [place] left room. Mom will continue to breastfeed according hunger cues, 8 to 12 times within 24 hours of life. LC discussed with parents that infant will start cluster feeding at 25 hours and this is normal.  Mom knows to call Nurse or [place] if she has any further questions, concerns or need assistance with latching infant to breast.  Maternal Data    Feeding Feeding Type: Breast Fed  LATCH Score                   Interventions    Lactation Tools Discussed/Used     Consult Status      Vicente Serene 03/18/2019, 8:18 PM

## 2019-03-18 NOTE — Progress Notes (Signed)
PPD #1, SVD, 2nd degree perineal, baby girl "Maria Hale"  S:  Reports feeling okay, sore and tired; did not sleep much last night. C/o perineal pain from repair - has been using ice packs, Dermoplast and Motrin and Tylenol as needed for pain              Tolerating po/ No nausea or vomiting / Denies dizziness or SOB             Bleeding is  Getting lighter             Up ad lib / ambulatory / voiding QS without difficulty  Newborn breast feeding - seeing good colostrum; interested in supplementing with formula until milk supply comes in  O:               VS: BP (!) 98/57   Pulse 78   Temp 98 F (36.7 C) (Oral)   Resp 16   Ht 5' (1.524 m)   Wt 82.6 kg   SpO2 100%   Breastfeeding Unknown   BMI 35.54 kg/m    LABS:              Recent Labs    03/17/19 0800 03/18/19 0619  WBC 9.6 14.0*  HGB 14.4 10.7*  PLT 237 207               Blood type: --/--/A POS, A POS Performed at [facility], [place] [address] [place], [place] [address]  [phone])  Rubella: Immune (01/09 0000)                     I&O: Intake/Output      07/27 0701 - 07/28 0700 07/28 0701 - 07/29 0700   Urine (mL/kg/hr) 850    Blood 150    Total Output 1000    Net -1000                       Physical Exam:             Alert and oriented X3  Lungs: Clear and unlabored  Heart: regular rate and rhythm / no murmurs  Abdomen: soft, non-tender, non-distended              Fundus: firm, mild tenderness, U-E  Perineum: well approximated 2nd degree repair; mild edema, no erythema, no ecchymosis   Lochia: moderate on pad; ice pack in place  Extremities: trace pedal edema, no calf pain or tenderness    A: PPD # 1, SVD  2nd degree repair  Mild ABL Anemia   Doing well - stable status  P: Routine post partum orders  Encouraged warm water sitz baths, ice packs, Dermoplast, and side lying positions for repair   Add Oxycodone IR 5mg  PO every 4 hrs PRN for severe pain  Continue lactation support   Niferex 150mg  PO  daily  Magnesium oxide 400mg  PO daily  D/C IV today  May shower  Anticipate d/c home tomorrow morning   Lars Pinks, MSN, CNM [facility]

## 2019-03-18 NOTE — Lactation Note (Signed)
This note was copied from a baby's chart. Lactation Consultation Note Baby 10 hrs old. Mom states has latched. Mom has 33 yr old that she had latching difficulty. Mom stated she tried BF for a couple of months.  Mom has everted nipples w/slight edema to areola. Reverse pressure demonstrated, noted to be helpful. Breast heavy. Hand expression w/colostrum noted. Mom excited, mom telling [place] her has no milk. Newborn feeding habits, STS, I&O, breast massage, supply and demand discussed. Baby placed at the breast. Wouldn't latch. Baby acting as if needed to spit up. Suction bulb discussed. Baby had no interest in BF at this time. Encouraged to call for assistance or questions. Lactation brochure given.  Patient Name: Girl Carolin Quang [id number] Date: 03/18/2019 Reason for consult: Initial assessment;Term   Maternal Data Has patient been taught Hand Expression?: Yes Does the patient have breastfeeding experience prior to this delivery?: Yes  Feeding Feeding Type: Breast Fed  LATCH Score Latch: Too sleepy or reluctant, no latch achieved, no sucking elicited.  Audible Swallowing: None  Type of Nipple: Everted at rest and after stimulation  Comfort (Breast/Nipple): Filling, red/small blisters or bruises, mild/mod discomfort(slight edema to areola)  Hold (Positioning): Full assist, staff holds infant at breast  LATCH Score: 3  Interventions Interventions: Breast feeding basics reviewed;Adjust position;Assisted with latch;Support pillows;Skin to skin;Position options;Breast massage;Hand express;Reverse pressure;Breast compression  Lactation Tools Discussed/Used WIC Program: No   Consult Status Consult Status: Follow-up Date: 03/18/19 Follow-up type: In-patient    Theodoro Kalata 03/18/2019, 3:39 AM

## 2019-03-18 NOTE — Plan of Care (Signed)
  Problem: Activity: Goal: Ability to tolerate increased activity will improve Note: Discussed with patient the importance of ambulating frequently and emptying bladder at least every two to three hours. Maria Hale, [name] [place]

## 2019-03-19 ENCOUNTER — Ambulatory Visit: Payer: Self-pay

## 2019-03-19 MED ORDER — MAGNESIUM OXIDE 400 (241.3 MG) MG PO TABS: 400.0000 mg | ORAL_TABLET | Freq: Every day | ORAL | Status: AC

## 2019-03-19 MED ORDER — POLYSACCHARIDE IRON COMPLEX 150 MG PO CAPS: 150.0000 mg | ORAL_CAPSULE | Freq: Every day | ORAL | Status: AC

## 2019-03-19 MED ORDER — IBUPROFEN 600 MG PO TABS: 600.0000 mg | ORAL_TABLET | Freq: Four times a day (QID) | ORAL | 0 refills | Status: AC

## 2019-03-19 MED ORDER — ACETAMINOPHEN 325 MG PO TABS: 650.0000 mg | ORAL_TABLET | ORAL | Status: AC | PRN

## 2019-03-19 MED ORDER — BENZOCAINE-MENTHOL 20-0.5 % EX AERO: 1.0000 "application " | INHALATION_SPRAY | CUTANEOUS | Status: AC | PRN

## 2019-03-19 MED ORDER — COCONUT OIL OIL: 1.0000 "application " | TOPICAL_OIL | 0 refills | Status: AC | PRN

## 2019-03-19 NOTE — Discharge Summary (Signed)
Obstetric Discharge Summary Reason for Admission: induction of labor Prenatal Procedures: ultrasound Intrapartum Procedures: spontaneous vaginal delivery and epidural Postpartum Procedures: none Complications-Operative and Postpartum: 2nd degree perineal laceration Hemoglobin  Date Value Ref Range Status  03/18/2019 10.7 (L) 12.0 - 15.0 g/dL Final    Comment:    REPEATED TO VERIFY   HCT  Date Value Ref Range Status  03/18/2019 31.7 (L) 36.0 - 46.0 % Final    Physical Exam:  General: alert, cooperative and no distress Lochia: appropriate Uterine Fundus: firm Incision: healing well DVT Evaluation: No cords or calf tenderness. No significant calf/ankle edema.  Discharge Diagnoses: Principal Problem:   Postpartum care following vaginal delivery (7/27) Active Problems:   Encounter for induction of labor -LGA   LGA (large for gestational age) fetus affecting management of mother   SVD (spontaneous vaginal delivery) Hypothyroidism  Discharge Information: Date: 03/19/2019 Activity: pelvic rest Diet: routine Medications:  Allergies as of 03/19/2019   No Known Allergies     Medication List    TAKE these medications   acetaminophen 325 MG tablet Commonly known as: Tylenol Take 2 tablets (650 mg total) by mouth every 4 (four) hours as needed (for pain scale < 4).   benzocaine-Menthol 20-0.5 % Aero Commonly known as: DERMOPLAST Apply 1 application topically as needed for irritation (perineal discomfort).   coconut oil Oil Apply 1 application topically as needed.   ibuprofen 600 MG tablet Commonly known as: ADVIL Take 1 tablet (600 mg total) by mouth every 6 (six) hours.   iron polysaccharides 150 MG capsule Commonly known as: NIFEREX Take 1 capsule (150 mg total) by mouth daily. Start taking on: March 20, 2019   levothyroxine 75 MCG tablet Commonly known as: SYNTHROID TAKE 1 TABLET BY MOUTH ONCE DAILY BEFORE BREAKFAST; MUST MAKE F/U APPT FOR ANYMORE REFILLS. What  changed:   how much to take  how to take this  when to take this  additional instructions   magnesium oxide 400 (241.3 Mg) MG tablet Commonly known as: MAG-OX Take 1 tablet (400 mg total) by mouth daily. Start taking on: March 20, 2019   prenatal multivitamin Tabs tablet Take 1 tablet by mouth daily at 12 noon.   Vitamin D3 125 MCG (5000 UT) Caps Take 1 capsule by mouth daily.            Discharge Care Instructions  (From admission, onward)         Start     Ordered   03/19/19 0000  Discharge wound care:    Comments: Sitz baths 2 times /day with warm water x 1 week. May add herbals: 1 ounce dried comfrey leaf* 1 ounce calendula flowers 1 ounce lavender flowers 1/2 ounce dried uva ursi leaves 1/2 ounce witch hazel blossoms (if you can find them) 1/2 ounce dried sage leaf 1/2 cup sea salt Directions: Bring 2 quarts of water to a boil. Turn off heat, and place 1 ounce (approximately 1 large handful) of the above mixed herbs (not the salt) into the pot. Steep, covered, for 30 minutes.  Strain the liquid well with a fine mesh strainer, and discard the herb material. Add 2 quarts of liquid to the tub, along with the 1/2 cup of salt. This medicinal liquid can also be made into compresses and peri-rinses.   03/19/19 1012         Condition: stable Instructions: refer to practice specific booklet Discharge to: boarding status Follow-up Information    Shea EvansMody, Vaishali, MD. Schedule an  appointment as soon as possible for a visit in 6 week(s).   Specialty: Obstetrics and Gynecology Contact information: [place] [id number] [phone]           Newborn Data: Live born female  Birth Weight: 8 lb 7.3 oz (3836 g) APGAR: 62, 9  Newborn Delivery   Time head delivered: 03/17/2019 17:54:00 Birth date/time: 03/17/2019 17:54:00 Delivery type: Vaginal, Spontaneous      Remain inpatient for phototherapy.  Juliene Pina 03/19/2019, 10:12 AM

## 2019-03-19 NOTE — Lactation Note (Signed)
This note was copied from a baby's chart. Lactation Consultation Note  Patient Name: Maria Hale [id number] Date: 03/19/2019 Reason for consult: Follow-up assessment;Term  Baby is 8 hours old and since [place] saw  Mom and baby this am the [facility] has worked with mom and baby has fed short periods of time. The [facility] reported to this [place] she assisted mom and ended up having to stimulate the baby through The entire feeding.  LC noted at this consult the baby would get off to a good start with few swallows and fall asleep.  [place] set up a DEBP and had mom pump both breast with warm moist heat on her breast  for 15 - 20 mins and EBM yield was drops. After pumping hand expressed and only few drops.  Mom reports her milk came in well with her 1st baby and with this pregnancy she  Had several breast changes.  LC recommended since the baby has been latching and his non - nutritive when she is breast feeding for short times - formula supplement is recommended since unable to get any EBM with using the DEBP.  Baby awaked to latch and latched with depth/ LC added and showed dad how the 37F SNS is inserted in the side of the mouth as the abby is feeding and she took 10 ml and stayed in a rhythmic feeding pattern. After 10 ml fell asleep.  LC discussed with mom and dad - this is  how a baby should feed intermittently.  [place] suspects the baby has been latching and hanging out latched.   LC Plan :  Feed with feeding cues and by 3 hours  STS feedings - wake up well prior to latching.  Work with baby to open wide and latch with firm support and wide open mouth.  After baby is in a good feeding pattern add the 37F SNS with 20 ml to start and by 48 hours 30 ml of EBM or formula.  Complete the feeding all on one breast and post pump both breast with the DEBP / save milk for the next feeding.    LC showed dad how to clean the pump pieces and the % F SNS.  Both mom and dad receptive to teaching.   Per mom will have a  DEBP at home - [employer]      Maternal Data Has patient been taught Hand Expression?: Yes Does the patient have breastfeeding experience prior to this delivery?: Yes  Feeding Feeding Type: Breast Fed  LATCH Score - baby latched fell took a few sucks and and sits with the nipple in her mouth.  Latch: Grasps breast easily, tongue down, lips flanged, rhythmical sucking.  Audible Swallowing: A few with stimulation  Type of Nipple: Everted at rest and after stimulation  Comfort (Breast/Nipple): Soft / non-tender  Hold (Positioning): Assistance needed to correctly position infant at breast and maintain latch.  LATCH Score: 8  Interventions Interventions: Breast feeding basics reviewed;Hand pump;DEBP  Lactation Tools Discussed/Used Tools: Pump;Flanges Flange Size: 24(good fit) Breast pump type: Manual;Double-Electric Breast Pump WIC Program: No Pump Review: Setup, frequency, and cleaning Initiated by:: MAI Date initiated:: 03/19/19   Consult Status Consult Status: Follow-up Date: 03/19/19 Follow-up type: In-patient    [place] 03/19/2019, 3:45 PM

## 2019-03-19 NOTE — Lactation Note (Signed)
This note was copied from a baby's chart. Lactation Consultation Note  Patient Name: Maria Hale Date: 03/19/2019 Reason for consult: Follow-up assessment;Term;Hyperbilirubinemia;Infant weight loss - at 36 hours Bili check 11.9  Baby is 93 hours old on double photo tx - started this this am.  Baby asleep with light one / D/C held.  Per mom baby last fed at 8 am for 5 mins and fell asleep.  Mom plans to eat her breast fast.  LC will recheck on mom and baby.   Maternal Data Has patient been taught Hand Expression?: Yes  Feeding Feeding Type: (last attempt to feed was at 8 am for 5 mins)  LATCH Score                   Interventions Interventions: Breast feeding basics reviewed  Lactation Tools Discussed/Used     Consult Status Consult Status: Follow-up Date: 03/19/19 Follow-up type: In-patient    [place] 03/19/2019, 9:32 AM

## 2019-03-20 ENCOUNTER — Ambulatory Visit: Payer: Self-pay

## 2019-03-20 NOTE — Lactation Note (Signed)
This note was copied from a baby's chart. Lactation Consultation Note  Patient Name: Maria Hale [id number] Date: 03/20/2019  Randel Books is 43 hours old.  Mom states that she is formula feeding because milk is not in.  Baby sleepy at breast.  Discussed milk coming to volume and the prevention and treatment of engorgement.  She has a breast pump at home.  Mom desires a feeding assist this morning.  Baby awake and showing feeding cues.  Positioned baby in football hold.  Colostrum easily hand expressed.  Baby latched after a few attempts.  She fed for 7 minutes and then fell asleep.  Instructed on waking techniques and breast massage.  Recommended mom pump every 3 hours to stimulate milk supply.  Reviewed outpatient lactation services and encouraged to call prn.   Maternal Data    Feeding Feeding Type: Breast Fed  LATCH Score Latch: Repeated attempts needed to sustain latch, nipple held in mouth throughout feeding, stimulation needed to elicit sucking reflex.  Audible Swallowing: A few with stimulation  Type of Nipple: Everted at rest and after stimulation  Comfort (Breast/Nipple): Soft / non-tender  Hold (Positioning): Assistance needed to correctly position infant at breast and maintain latch.  LATCH Score: 7  Interventions    Lactation Tools Discussed/Used     Consult Status Consult Status: Complete Follow-up type: Call as needed    Ave [place] 03/20/2019, 9:04 AM

## 2019-04-29 DIAGNOSIS — E039 Hypothyroidism, unspecified: Secondary | ICD-10-CM | POA: Diagnosis not present

## 2019-06-09 ENCOUNTER — Other Ambulatory Visit: Payer: Self-pay | Admitting: Endocrinology

## 2019-06-09 DIAGNOSIS — E063 Autoimmune thyroiditis: Secondary | ICD-10-CM

## 2019-06-12 ENCOUNTER — Other Ambulatory Visit: Payer: Self-pay

## 2019-06-12 ENCOUNTER — Other Ambulatory Visit (INDEPENDENT_AMBULATORY_CARE_PROVIDER_SITE_OTHER): Payer: BC Managed Care – PPO

## 2019-06-12 DIAGNOSIS — E063 Autoimmune thyroiditis: Secondary | ICD-10-CM

## 2019-06-12 LAB — T4, FREE: Free T4: 2.03 ng/dL — ABNORMAL HIGH (ref 0.60–1.60)

## 2019-06-12 LAB — TSH: TSH: <0.01 u[IU]/mL — ABNORMAL LOW (ref 0.35–4.50)

## 2019-06-16 ENCOUNTER — Other Ambulatory Visit: Payer: Self-pay

## 2019-06-16 ENCOUNTER — Ambulatory Visit (INDEPENDENT_AMBULATORY_CARE_PROVIDER_SITE_OTHER): Payer: BC Managed Care – PPO | Admitting: Endocrinology

## 2019-06-16 ENCOUNTER — Encounter: Payer: Self-pay | Admitting: Endocrinology

## 2019-06-16 VITALS — BP 116/70 | HR 81 | Ht 60.0 in | Wt 162.8 lb

## 2019-06-16 DIAGNOSIS — O905 Postpartum thyroiditis: Secondary | ICD-10-CM | POA: Diagnosis not present

## 2019-06-16 NOTE — Progress Notes (Signed)
Patient ID: Maria Hale, female   DOB: [birth year], 33 y.o.   MRN: [id number]           Referring Physician: Shea Evans  Reason for Appointment:  Hypothyroidism, follow-up visit    History of Present Illness:   Hypothyroidism was first diagnosed in 2015  Prior history: At the time of diagnosis patient was probably being evaluated by her PCP for a left thyroid nodule At that time she was not having any symptoms of  fatigue, cold sensitivity, difficulty concentrating, dry skin, weight gain or hair loss per Baseline labs are not available.  But she was started on 50 g daily at the time of diagnosis With this she did not feel any different.  In the first year she was also pregnant and she did not require any adjustment of her dosage during pregnancy  Subsequent details of her treatment are not available but her dose was increased at some time to take the 50 g daily 9 days a week instead of once a day TSH in 7/17 was normal done by gynecologist at 0.81 At that time she was complaining of persistent hair loss since her delivery in 2015 She was told by her PCP that she needed an increase in her dosage and was switched to 75 g.  With this the patient thinks that her hair loss improved The patient is somewhat inconsistent with her history about her medication dosages but apparently after this dosage increase she had her TSH tested again in [place] and she was told that it was very low She stopped taking the medication for at least a week and then went back to taking 50 g 9 days a week as before  RECENT history:  On her initial visit her TSH was significantly high at 16.7 when she was taking 50 g somewhat irregularly She was told to increase her dose back to 75 g With this her TSH had been consistently normal including on her last visit in 05/2018  She thinks she was monitored during pregnancy for thyroid levels by obstetrician but no records available She delivered her baby on  03/17/2019  Since then she does at times feel fatigued Also occasionally has palpitations and feeling warm Does not feel shaky, sweaty  She has taken her thyroid supplement regularly before breakfast daily Takes her prenatal vitamins in the afternoon  TSH is now completely suppressed and free T4 is above normal         Patient's weight history is as follows:   Wt Readings from Last 3 Encounters:  06/16/19 162 lb 12.8 oz (73.8 kg)  03/17/19 182 lb (82.6 kg)  05/24/18 159 lb (72.1 kg)   Thyroid function results have been as follows:  Lab Results  Component Value Date   TSH <0.01 Repeated and verified X2. (L) 06/12/2019   TSH 3.75 05/22/2018   TSH 1.86 12/14/2017   TSH 1.07 06/14/2017   FREET4 2.03 (H) 06/12/2019   FREET4 0.87 12/14/2017   FREET4 1.04 06/14/2017   FREET4 0.63 04/16/2017   THYROID NODULE:  Last ultrasound done in 5/19 compared to the previous one in 11/2016 showed the following:  Left lower pole nodule measures 1.7 x 1.3 x 1.4 cm  and previously measured up to 1.9 cm. This was previously biopsied in 2015. previously needle aspiration biopsy was indeterminate  Nodule # 2: Location: Left; Inferior posterior  ACR TI-RADS total points: 4. Maximum size: 1.3 cm   Past Medical History:  Diagnosis Date  .  Hypothyroidism     Past Surgical History:  Procedure Laterality Date  . NO PAST SURGERIES      Family History  Problem Relation Age of Onset  . Thyroid disease Maternal Uncle   . Hyperlipidemia Father   . Ovarian cancer Paternal Grandmother   . Diabetes Mother   . Hypertension Mother     Social History:  reports that she has never smoked. She has never used smokeless tobacco. She reports that she does not drink alcohol or use drugs.  Allergies: No Known Allergies  Allergies as of 06/16/2019   No Known Allergies     Medication List       Accurate as of June 16, 2019  1:46 PM. If you have any questions, ask your nurse or doctor.         acetaminophen 325 MG tablet Commonly known as: Tylenol Take 2 tablets (650 mg total) by mouth every 4 (four) hours as needed (for pain scale < 4).   benzocaine-Menthol 20-0.5 % Aero Commonly known as: DERMOPLAST Apply 1 application topically as needed for irritation (perineal discomfort).   coconut oil Oil Apply 1 application topically as needed.   ibuprofen 600 MG tablet Commonly known as: ADVIL Take 1 tablet (600 mg total) by mouth every 6 (six) hours.   iron polysaccharides 150 MG capsule Commonly known as: NIFEREX Take 1 capsule (150 mg total) by mouth daily.   levothyroxine 75 MCG tablet Commonly known as: SYNTHROID TAKE 1 TABLET BY MOUTH ONCE DAILY BEFORE BREAKFAST; MUST MAKE F/U APPT FOR ANYMORE REFILLS. What changed:   how much to take  how to take this  when to take this  additional instructions   magnesium oxide 400 (241.3 Mg) MG tablet Commonly known as: MAG-OX Take 1 tablet (400 mg total) by mouth daily.   prenatal multivitamin Tabs tablet Take 1 tablet by mouth daily at 12 noon.   Vitamin D3 125 MCG (5000 UT) Caps Take 1 capsule by mouth daily.          Review of Systems            She takes vitamin D but no other supplements   Examination:     BP 116/70 (BP Location: Left Arm, Patient Position: Sitting, Cuff Size: Normal)   Pulse 81   Ht 5' (1.524 m)   Wt 162 lb 12.8 oz (73.8 kg)   SpO2 98%   BMI 31.79 kg/m   She looks well No swelling of the eyes  THYROID:    Right thyroid lobe is not enlarged Left thyroid nodule is palpable on swallowing, has about a 1.5 cm firm nodule    Hands are relatively warm No tremor  Skin normal Biceps reflexes appear brisk   Assessment:  HYPOTHYROIDISM, primary  She has had only mild symptoms of fatigue both at diagnosis and subsequently even with TSH is high as 16 Currently is taking 75 mcg levothyroxine Although she has minimal symptoms she appears to have postpartum thyroiditis TSH is 0  and free T4 is high   THYROID nodules on the left:  Her exam shows nodule size about the same Her previously biopsied nodule from 2015 was stable in 5/19 In 2019 she had a relatively newer 1.3 cm nodule on the left side, observation recommended for this on ultrasound  PLAN:   She will temporarily stop levothyroxine  Will reexamine her in follow-up and consider a follow-up ultrasound  Follow-up in 6 weeks   Yola Paradiso 06/16/2019, 1:46  PM     Note: This office note was prepared with Estate agent. Any transcriptional errors that result from this process are unintentional.

## 2019-07-15 ENCOUNTER — Other Ambulatory Visit (INDEPENDENT_AMBULATORY_CARE_PROVIDER_SITE_OTHER): Payer: BC Managed Care – PPO

## 2019-07-15 ENCOUNTER — Other Ambulatory Visit: Payer: Self-pay

## 2019-07-15 ENCOUNTER — Telehealth: Payer: Self-pay

## 2019-07-15 DIAGNOSIS — O905 Postpartum thyroiditis: Secondary | ICD-10-CM

## 2019-07-15 LAB — TSH: TSH: 16.71 u[IU]/mL — ABNORMAL HIGH (ref 0.35–4.50)

## 2019-07-15 LAB — T4, FREE: Free T4: 0.28 ng/dL — ABNORMAL LOW (ref 0.60–1.60)

## 2019-07-15 NOTE — Progress Notes (Signed)
Patient ID: Maria Hale, female   DOB: 1985-11-23, 33 y.o.   MRN: [id number]           Referring Physician: Shea EvansVaishali Mody  Reason for Appointment:  Hypothyroidism, follow-up visit  Today's office visit was provided via telemedicine using video technique The patient was explained the limitations of evaluation and management by telemedicine and the availability of in person appointments.  The patient understood the limitations and agreed to proceed. Patient also understood that the telehealth visit is billable. . Location of the patient: Patient's home . Location of the provider: Physician office Only the patient and myself were participating in the encounter      History of Present Illness:   Hypothyroidism was first diagnosed in 2015  Prior history: At the time of diagnosis patient was probably being evaluated by her PCP for a left thyroid nodule At that time she was not having any symptoms of  fatigue, cold sensitivity, difficulty concentrating, dry skin, weight gain or hair loss per Baseline labs are not available.  But she was started on 50 g daily at the time of diagnosis With this she did not feel any different.  In the first year she was also pregnant and she did not require any adjustment of her dosage during pregnancy  Subsequent details of her treatment are not available but her dose was increased at some time to take the 50 g daily 9 days a week instead of once a day TSH in 7/17 was normal done by gynecologist at 0.81 At that time she was complaining of persistent hair loss since her delivery in 2015 She was told by her PCP that she needed an increase in her dosage and was switched to 75 g.  With this the patient thinks that her hair loss improved The patient is somewhat inconsistent with her history about her medication dosages but apparently after this dosage increase she had her TSH tested again in [place] and she was told that it was very low She stopped taking the  medication for at least a week and then went back to taking 50 g 9 days a week as before  RECENT history:  On her initial visit her TSH was significantly high at 16.7 when she was taking 50 g somewhat irregularly She was told to increase her dose back to 75 g With this her TSH had been consistently normal including on her last visit in 05/2018  She thinks she was monitored during pregnancy for thyroid levels by obstetrician without change in her dosage She delivered her baby on 03/17/2019  On a follow-up in 10/20 she was found to have hyperthyroidism and had symptoms of palpitations, feeling warm, fatigue She has been off her levothyroxine since then  In the last week she has started feeling progressively more fatigued with lack of energy, has a little weight gain of about 2 pounds and may feel cold at times  She generally takes her thyroid supplement regularly before breakfast daily Takes her prenatal vitamins in the afternoon  TSH is now back up to hypothyroid levels at 16.7 compared to previously suppressed level Free T4 is significantly below normal         Patient's weight history is as follows:   Wt Readings from Last 3 Encounters:  06/16/19 162 lb 12.8 oz (73.8 kg)  03/17/19 182 lb (82.6 kg)  05/24/18 159 lb (72.1 kg)   Thyroid function results have been as follows:  Lab Results  Component Value Date  TSH 16.71 (H) 07/15/2019   TSH <0.01 Repeated and verified X2. (L) 06/12/2019   TSH 3.75 05/22/2018   TSH 1.86 12/14/2017   FREET4 0.28 (L) 07/15/2019   FREET4 2.03 (H) 06/12/2019   FREET4 0.87 12/14/2017   FREET4 1.04 06/14/2017   THYROID NODULE:  Last ultrasound done in 5/19 compared to the previous one in 11/2016 showed the following:  Left lower pole nodule measures 1.7 x 1.3 x 1.4 cm  and previously measured up to 1.9 cm. This was previously biopsied in 2015. previously needle aspiration biopsy was indeterminate  Nodule # 2: Location: Left; Inferior  posterior  ACR TI-RADS total points: 4. Maximum size: 1.3 cm   Past Medical History:  Diagnosis Date  . Hypothyroidism     Past Surgical History:  Procedure Laterality Date  . NO PAST SURGERIES      Family History  Problem Relation Age of Onset  . Thyroid disease Maternal Uncle   . Hyperlipidemia Father   . Ovarian cancer Paternal Grandmother   . Diabetes Mother   . Hypertension Mother     Social History:  reports that she has never smoked. She has never used smokeless tobacco. She reports that she does not drink alcohol or use drugs.  Allergies: No Known Allergies  Allergies as of 07/16/2019   No Known Allergies     Medication List       Accurate as of July 16, 2019  9:08 AM. If you have any questions, ask your nurse or doctor.        acetaminophen 325 MG tablet Commonly known as: Tylenol Take 2 tablets (650 mg total) by mouth every 4 (four) hours as needed (for pain scale < 4).   benzocaine-Menthol 20-0.5 % Aero Commonly known as: DERMOPLAST Apply 1 application topically as needed for irritation (perineal discomfort).   coconut oil Oil Apply 1 application topically as needed.   ibuprofen 600 MG tablet Commonly known as: ADVIL Take 1 tablet (600 mg total) by mouth every 6 (six) hours.   iron polysaccharides 150 MG capsule Commonly known as: NIFEREX Take 1 capsule (150 mg total) by mouth daily.   levothyroxine 75 MCG tablet Commonly known as: SYNTHROID TAKE 1 TABLET BY MOUTH ONCE DAILY BEFORE BREAKFAST; MUST MAKE F/U APPT FOR ANYMORE REFILLS. What changed:   how much to take  how to take this  when to take this  additional instructions   magnesium oxide 400 (241.3 Mg) MG tablet Commonly known as: MAG-OX Take 1 tablet (400 mg total) by mouth daily.   prenatal multivitamin Tabs tablet Take 1 tablet by mouth daily at 12 noon.   Vitamin D3 125 MCG (5000 UT) Caps Take 1 capsule by mouth daily.          Review of Systems             She takes vitamin D but no other supplements   Examination:     There were no vitals taken for this visit.  No exam done, patient is alert Has mild puffiness of the face    Assessment:  HYPOTHYROIDISM, primary  She has had autoimmune thyroid disease Recently with postpartum thyroiditis she had mild hyperthyroidism and is off levothyroxine for the last 4 weeks or so Previously was taking 75 mcg levothyroxine  Now for the last week or so she appears to have much more fatigue and labs indicate significant hypothyroidism  PLAN:   She will go back to taking 75 mcg of levothyroxine  However if she is not subjectively better in a couple of weeks she will call Follow-up in 1 month with labs We will also need to recheck her thyroid nodule at that time   Elayne Snare 07/16/2019, 9:08 AM     Note: This office note was prepared with Dragon voice recognition system technology. Any transcriptional errors that result from this process are unintentional.

## 2019-07-15 NOTE — Telephone Encounter (Signed)
We will need to have her come in today for labs and see her tomorrow

## 2019-07-15 NOTE — Telephone Encounter (Signed)
Patient called in stating she has not taking her medication in a month, and does not feel well. Patient is requesting to either come in for labs or appt today.   Last lab visit was 06/12/19  Last f/u was 06/16/19    Please advise

## 2019-07-15 NOTE — Telephone Encounter (Signed)
Does pt need to resume taking medication and then push her appt out? Or would you like to do something different?

## 2019-07-16 ENCOUNTER — Ambulatory Visit (INDEPENDENT_AMBULATORY_CARE_PROVIDER_SITE_OTHER): Payer: BC Managed Care – PPO | Admitting: Endocrinology

## 2019-07-16 ENCOUNTER — Encounter: Payer: Self-pay | Admitting: Endocrinology

## 2019-07-16 DIAGNOSIS — E063 Autoimmune thyroiditis: Secondary | ICD-10-CM

## 2019-07-16 MED ORDER — LEVOTHYROXINE SODIUM 75 MCG PO TABS: 75.0000 ug | ORAL_TABLET | Freq: Every day | ORAL | 2 refills | Status: DC

## 2019-07-22 ENCOUNTER — Other Ambulatory Visit: Payer: BC Managed Care – PPO

## 2019-07-28 ENCOUNTER — Ambulatory Visit: Payer: BC Managed Care – PPO | Admitting: Endocrinology

## 2019-08-12 ENCOUNTER — Other Ambulatory Visit (INDEPENDENT_AMBULATORY_CARE_PROVIDER_SITE_OTHER): Payer: BC Managed Care – PPO

## 2019-08-12 ENCOUNTER — Other Ambulatory Visit: Payer: Self-pay

## 2019-08-12 DIAGNOSIS — E063 Autoimmune thyroiditis: Secondary | ICD-10-CM | POA: Diagnosis not present

## 2019-08-12 LAB — TSH: TSH: 41.47 u[IU]/mL — ABNORMAL HIGH (ref 0.35–4.50)

## 2019-08-12 LAB — T4, FREE: Free T4: 0.68 ng/dL (ref 0.60–1.60)

## 2019-08-12 NOTE — Patient Instructions (Signed)
[id number] 

## 2019-08-13 ENCOUNTER — Encounter: Payer: Self-pay | Admitting: Endocrinology

## 2019-08-13 ENCOUNTER — Ambulatory Visit (INDEPENDENT_AMBULATORY_CARE_PROVIDER_SITE_OTHER): Payer: BC Managed Care – PPO | Admitting: Endocrinology

## 2019-08-13 DIAGNOSIS — O905 Postpartum thyroiditis: Secondary | ICD-10-CM | POA: Diagnosis not present

## 2019-08-13 DIAGNOSIS — E063 Autoimmune thyroiditis: Secondary | ICD-10-CM

## 2019-08-13 MED ORDER — EUTHYROX 112 MCG PO TABS: 112.0000 ug | ORAL_TABLET | Freq: Every day | ORAL | 2 refills | Status: AC

## 2019-08-13 NOTE — Progress Notes (Signed)
Patient ID: Maria Hale, female   DOB: 10/06/1985, 33 y.o.   MRN: [id number]           Referring Physician: Shea EvansVaishali Mody  Reason for Appointment:  Hypothyroidism, follow-up visit  Today's office visit was provided via telemedicine using video technique The patient was explained the limitations of evaluation and management by telemedicine and the availability of in person appointments.  The patient understood the limitations and agreed to proceed. Patient also understood that the telehealth visit is billable. . Location of the patient: Patient's home . Location of the provider: Physician office Only the patient and myself were participating in the encounter     History of Present Illness:   Hypothyroidism was first diagnosed in 2015  Prior history: At the time of diagnosis patient was probably being evaluated by her PCP for a left thyroid nodule At that time she was not having any symptoms of  fatigue, cold sensitivity, difficulty concentrating, dry skin, weight gain or hair loss per Baseline labs are not available.  But she was started on 50 g daily at the time of diagnosis With this she did not feel any different.  In the first year she was also pregnant and she did not require any adjustment of her dosage during pregnancy  Subsequent details of her treatment are not available but her dose was increased at some time to take the 50 g daily 9 days a week instead of once a day TSH in 7/17 was normal done by gynecologist at 0.81 At that time she was complaining of persistent hair loss since her delivery in 2015 She was told by her PCP that she needed an increase in her dosage and was switched to 75 g.  With this the patient thinks that her hair loss improved The patient is somewhat inconsistent with her history about her medication dosages but apparently after this dosage increase she had [place] tested again in [place] and she was told that it was very low She stopped taking the  medication for at least a week and then went back to taking 50 g 9 days a week as before  RECENT history:  On her initial visit her TSH was significantly high at 16.7 when she was taking 50 g somewhat irregularly She was told to increase her dose back to 75 g With this her TSH had been consistently normal including on her last visit in 05/2018  She thinks she was monitored during pregnancy for thyroid levels by obstetrician without change in her dosage She delivered her baby on 03/17/2019  On a follow-up in 10/20 she was found to have hyperthyroidism and had symptoms of palpitations, feeling warm, fatigue Levothyroxine was temporarily stopped at that time  In mid November she had started feeling progressively more fatigued with some weight gain and cold intolerance  She was started back on her levothyroxine 75 mcg daily However she still thinks that she is gaining some weight and has cold intolerance.  May have some improvement in her energy level but not back to normal   She consistently takes her thyroid supplement daily before breakfast daily and has not missed any doses Takes her prenatal vitamins in the afternoon  TSH is now much higher at 41.5 compared to 16.7 Free T4 is improved but still low normal         Patient's weight history is as follows:   Wt Readings from Last 3 Encounters:  06/16/19 162 lb 12.8 oz (73.8 kg)  03/17/19  182 lb (82.6 kg)  05/24/18 159 lb (72.1 kg)   Thyroid function results have been as follows:  Lab Results  Component Value Date   TSH 41.47 (H) 08/12/2019   TSH 16.71 (H) 07/15/2019   TSH <0.01 Repeated and verified X2. (L) 06/12/2019   TSH 3.75 05/22/2018   FREET4 0.68 08/12/2019   FREET4 0.28 (L) 07/15/2019   FREET4 2.03 (H) 06/12/2019   FREET4 0.87 12/14/2017   THYROID NODULE:  Last ultrasound done in 5/19 compared to the previous one in 11/2016 showed the following:  Left lower pole nodule measures 1.7 x 1.3 x 1.4 cm  and  previously measured up to 1.9 cm. This was previously biopsied in 2015. previously needle aspiration biopsy was indeterminate  Nodule # 2: Location: Left; Inferior posterior  ACR TI-RADS total points: 4. Maximum size: 1.3 cm   Past Medical History:  Diagnosis Date  . Hypothyroidism     Past Surgical History:  Procedure Laterality Date  . NO PAST SURGERIES      Family History  Problem Relation Age of Onset  . Thyroid disease Maternal Uncle   . Hyperlipidemia Father   . Ovarian cancer Paternal Grandmother   . Diabetes Mother   . Hypertension Mother     Social History:  reports that she has never smoked. She has never used smokeless tobacco. She reports that she does not drink alcohol or use drugs.  Allergies: No Known Allergies  Allergies as of 08/13/2019   No Known Allergies     Medication List       Accurate as of August 13, 2019 10:21 AM. If you have any questions, ask your nurse or doctor.        acetaminophen 325 MG tablet Commonly known as: Tylenol Take 2 tablets (650 mg total) by mouth every 4 (four) hours as needed (for pain scale < 4).   benzocaine-Menthol 20-0.5 % Aero Commonly known as: DERMOPLAST Apply 1 application topically as needed for irritation (perineal discomfort).   coconut oil Oil Apply 1 application topically as needed.   ibuprofen 600 MG tablet Commonly known as: ADVIL Take 1 tablet (600 mg total) by mouth every 6 (six) hours.   iron polysaccharides 150 MG capsule Commonly known as: NIFEREX Take 1 capsule (150 mg total) by mouth daily.   levothyroxine 75 MCG tablet Commonly known as: SYNTHROID Take 1 tablet (75 mcg total) by mouth daily before breakfast.   magnesium oxide 400 (241.3 Mg) MG tablet Commonly known as: MAG-OX Take 1 tablet (400 mg total) by mouth daily.   prenatal multivitamin Tabs tablet Take 1 tablet by mouth daily at 12 noon.   Vitamin D3 125 MCG (5000 UT) Caps Take 1 capsule by mouth daily.           Review of Systems            She takes vitamin D and prenatal vitamins   Examination:     There were no vitals taken for this visit.  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  She now appears to be significantly more hypothyroid compared to baseline since even with restarting 75 mcg levothyroxine about 4 weeks ago her TSH is markedly increased at 41 She is still symptomatic   PLAN:   She will need to go up on the dose further at least temporarily of her levothyroxine She is getting Euthyrox  and will continue the same brand New prescription for 112 mcg of levothyroxine has been sent to her pharmacy  Reminded her to take her supplement with water at least 30-minute before breakfast and no vitamins at the same time  Needs to follow-up in 6 weeks again    Reather Littler 08/13/2019, 10:21 AM     Note: This office note was prepared with Dragon voice recognition system technology. Any transcriptional errors that result from this process are unintentional.

## 2019-08-19 ENCOUNTER — Ambulatory Visit: Payer: BC Managed Care – PPO | Admitting: Endocrinology

## 2019-09-03 DIAGNOSIS — R5383 Other fatigue: Secondary | ICD-10-CM | POA: Diagnosis not present

## 2019-09-03 DIAGNOSIS — O99345 Other mental disorders complicating the puerperium: Secondary | ICD-10-CM | POA: Diagnosis not present

## 2019-09-03 DIAGNOSIS — F419 Anxiety disorder, unspecified: Secondary | ICD-10-CM | POA: Diagnosis not present

## 2019-10-21 ENCOUNTER — Encounter: Payer: Self-pay | Admitting: Endocrinology

## 2019-10-22 DIAGNOSIS — E039 Hypothyroidism, unspecified: Secondary | ICD-10-CM | POA: Diagnosis not present

## 2019-10-22 DIAGNOSIS — E041 Nontoxic single thyroid nodule: Secondary | ICD-10-CM | POA: Diagnosis not present

## 2019-11-03 ENCOUNTER — Telehealth: Payer: Self-pay | Admitting: Endocrinology

## 2019-11-03 NOTE — Telephone Encounter (Signed)
Patient dismissed from [facility] by Reather Littler, MD, effective 10/21/19. Dismissal Letter sent out by 1st class mail. [employer]

## 2020-01-22 DIAGNOSIS — E041 Nontoxic single thyroid nodule: Secondary | ICD-10-CM | POA: Diagnosis not present

## 2020-01-22 DIAGNOSIS — E039 Hypothyroidism, unspecified: Secondary | ICD-10-CM | POA: Diagnosis not present

## 2020-01-26 DIAGNOSIS — E041 Nontoxic single thyroid nodule: Secondary | ICD-10-CM | POA: Diagnosis not present

## 2020-01-26 DIAGNOSIS — E039 Hypothyroidism, unspecified: Secondary | ICD-10-CM | POA: Diagnosis not present

## 2020-05-27 DIAGNOSIS — J209 Acute bronchitis, unspecified: Secondary | ICD-10-CM | POA: Diagnosis not present
